# Patient Record
Sex: Female | Born: 1955 | Race: White | Hispanic: No | Smoking: Current every day smoker
Health system: Southern US, Community
[De-identification: ages and names within clinical notes are randomized; demographics above are authoritative.]

## PROBLEM LIST (undated history)

## (undated) DIAGNOSIS — M797 Fibromyalgia: Secondary | ICD-10-CM

## (undated) DIAGNOSIS — I10 Essential (primary) hypertension: Secondary | ICD-10-CM

## (undated) DIAGNOSIS — J449 Chronic obstructive pulmonary disease, unspecified: Secondary | ICD-10-CM

## (undated) DIAGNOSIS — I639 Cerebral infarction, unspecified: Secondary | ICD-10-CM

## (undated) DIAGNOSIS — K219 Gastro-esophageal reflux disease without esophagitis: Secondary | ICD-10-CM

## (undated) DIAGNOSIS — K449 Diaphragmatic hernia without obstruction or gangrene: Secondary | ICD-10-CM

## (undated) DIAGNOSIS — H269 Unspecified cataract: Secondary | ICD-10-CM

## (undated) DIAGNOSIS — F32A Depression, unspecified: Secondary | ICD-10-CM

## (undated) DIAGNOSIS — M1990 Unspecified osteoarthritis, unspecified site: Secondary | ICD-10-CM

## (undated) DIAGNOSIS — F319 Bipolar disorder, unspecified: Secondary | ICD-10-CM

## (undated) DIAGNOSIS — F329 Major depressive disorder, single episode, unspecified: Secondary | ICD-10-CM

## (undated) DIAGNOSIS — F1910 Other psychoactive substance abuse, uncomplicated: Secondary | ICD-10-CM

## (undated) DIAGNOSIS — F419 Anxiety disorder, unspecified: Secondary | ICD-10-CM

## (undated) DIAGNOSIS — F1010 Alcohol abuse, uncomplicated: Secondary | ICD-10-CM

## (undated) HISTORY — PX: CERVICAL FUSION: SHX112

## (undated) HISTORY — DX: Bipolar disorder, unspecified: F31.9

## (undated) HISTORY — PX: OVARY SURGERY: SHX727

## (undated) HISTORY — PX: APPENDECTOMY: SHX54

## (undated) HISTORY — DX: Gastro-esophageal reflux disease without esophagitis: K21.9

## (undated) HISTORY — DX: Unspecified osteoarthritis, unspecified site: M19.90

## (undated) HISTORY — PX: COLONOSCOPY: SHX174

## (undated) HISTORY — DX: Major depressive disorder, single episode, unspecified: F32.9

## (undated) HISTORY — DX: Chronic obstructive pulmonary disease, unspecified: J44.9

## (undated) HISTORY — DX: Essential (primary) hypertension: I10

## (undated) HISTORY — DX: Fibromyalgia: M79.7

## (undated) HISTORY — PX: TUBAL LIGATION: SHX77

## (undated) HISTORY — DX: Unspecified cataract: H26.9

## (undated) HISTORY — DX: Other psychoactive substance abuse, uncomplicated: F19.10

## (undated) HISTORY — DX: Anxiety disorder, unspecified: F41.9

## (undated) HISTORY — DX: Depression, unspecified: F32.A

## (undated) HISTORY — DX: Cerebral infarction, unspecified: I63.9

## (undated) HISTORY — DX: Diaphragmatic hernia without obstruction or gangrene: K44.9

## (undated) HISTORY — PX: OTHER SURGICAL HISTORY: SHX169

---

## 1997-11-09 ENCOUNTER — Emergency Department (HOSPITAL_COMMUNITY): Admission: EM | Admit: 1997-11-09 | Discharge: 1997-11-09 | Payer: Self-pay | Admitting: Emergency Medicine

## 1998-07-30 ENCOUNTER — Other Ambulatory Visit: Admission: RE | Admit: 1998-07-30 | Discharge: 1998-07-30 | Payer: Self-pay | Admitting: Obstetrics and Gynecology

## 1999-02-05 ENCOUNTER — Other Ambulatory Visit: Admission: RE | Admit: 1999-02-05 | Discharge: 1999-02-05 | Payer: Self-pay | Admitting: Obstetrics and Gynecology

## 2001-04-03 ENCOUNTER — Other Ambulatory Visit: Admission: RE | Admit: 2001-04-03 | Discharge: 2001-04-03 | Payer: Self-pay | Admitting: Obstetrics and Gynecology

## 2002-03-21 ENCOUNTER — Encounter: Payer: Self-pay | Admitting: Emergency Medicine

## 2002-03-21 ENCOUNTER — Observation Stay (HOSPITAL_COMMUNITY): Admission: EM | Admit: 2002-03-21 | Discharge: 2002-03-21 | Payer: Self-pay | Admitting: Emergency Medicine

## 2002-10-15 ENCOUNTER — Emergency Department (HOSPITAL_COMMUNITY): Admission: AD | Admit: 2002-10-15 | Discharge: 2002-10-15 | Payer: Self-pay | Admitting: Emergency Medicine

## 2004-03-09 ENCOUNTER — Inpatient Hospital Stay (HOSPITAL_COMMUNITY): Admission: RE | Admit: 2004-03-09 | Discharge: 2004-03-11 | Payer: Self-pay | Admitting: Psychiatry

## 2004-03-09 ENCOUNTER — Ambulatory Visit: Payer: Self-pay | Admitting: Psychiatry

## 2005-02-08 ENCOUNTER — Inpatient Hospital Stay (HOSPITAL_COMMUNITY): Admission: EM | Admit: 2005-02-08 | Discharge: 2005-02-10 | Payer: Self-pay | Admitting: Emergency Medicine

## 2005-03-12 ENCOUNTER — Emergency Department (HOSPITAL_COMMUNITY): Admission: EM | Admit: 2005-03-12 | Discharge: 2005-03-12 | Payer: Self-pay | Admitting: Emergency Medicine

## 2005-08-03 ENCOUNTER — Encounter: Admission: RE | Admit: 2005-08-03 | Discharge: 2005-08-03 | Payer: Self-pay | Admitting: Obstetrics and Gynecology

## 2006-01-27 ENCOUNTER — Emergency Department (HOSPITAL_COMMUNITY): Admission: EM | Admit: 2006-01-27 | Discharge: 2006-01-27 | Payer: Self-pay

## 2006-01-28 ENCOUNTER — Inpatient Hospital Stay (HOSPITAL_COMMUNITY): Admission: EM | Admit: 2006-01-28 | Discharge: 2006-01-29 | Payer: Self-pay | Admitting: Internal Medicine

## 2006-09-01 ENCOUNTER — Encounter: Admission: RE | Admit: 2006-09-01 | Discharge: 2006-09-01 | Payer: Self-pay | Admitting: Internal Medicine

## 2007-02-21 ENCOUNTER — Emergency Department (HOSPITAL_COMMUNITY): Admission: EM | Admit: 2007-02-21 | Discharge: 2007-02-21 | Payer: Self-pay | Admitting: Emergency Medicine

## 2007-06-06 ENCOUNTER — Emergency Department (HOSPITAL_COMMUNITY): Admission: EM | Admit: 2007-06-06 | Discharge: 2007-06-06 | Payer: Self-pay | Admitting: Emergency Medicine

## 2007-11-20 ENCOUNTER — Emergency Department (HOSPITAL_COMMUNITY): Admission: EM | Admit: 2007-11-20 | Discharge: 2007-11-20 | Payer: Self-pay | Admitting: Emergency Medicine

## 2008-10-11 ENCOUNTER — Emergency Department (HOSPITAL_COMMUNITY): Admission: EM | Admit: 2008-10-11 | Discharge: 2008-10-11 | Payer: Self-pay | Admitting: Emergency Medicine

## 2008-10-21 ENCOUNTER — Inpatient Hospital Stay (HOSPITAL_COMMUNITY): Admission: RE | Admit: 2008-10-21 | Discharge: 2008-10-22 | Payer: Self-pay | Admitting: Neurosurgery

## 2009-03-04 ENCOUNTER — Emergency Department (HOSPITAL_COMMUNITY): Admission: EM | Admit: 2009-03-04 | Discharge: 2009-03-04 | Payer: Self-pay | Admitting: Emergency Medicine

## 2009-09-05 ENCOUNTER — Ambulatory Visit: Payer: Self-pay | Admitting: Thoracic Surgery

## 2009-09-10 ENCOUNTER — Encounter (INDEPENDENT_AMBULATORY_CARE_PROVIDER_SITE_OTHER): Payer: Self-pay | Admitting: *Deleted

## 2009-09-10 ENCOUNTER — Encounter: Admission: RE | Admit: 2009-09-10 | Discharge: 2009-09-10 | Payer: Self-pay | Admitting: Thoracic Surgery

## 2009-09-24 ENCOUNTER — Ambulatory Visit: Payer: Self-pay | Admitting: Thoracic Surgery

## 2010-02-21 ENCOUNTER — Encounter: Payer: Self-pay | Admitting: Internal Medicine

## 2010-02-21 ENCOUNTER — Other Ambulatory Visit: Payer: Self-pay | Admitting: Thoracic Surgery

## 2010-02-21 DIAGNOSIS — R911 Solitary pulmonary nodule: Secondary | ICD-10-CM

## 2010-02-23 ENCOUNTER — Encounter: Payer: Self-pay | Admitting: Family Medicine

## 2010-02-27 ENCOUNTER — Encounter (INDEPENDENT_AMBULATORY_CARE_PROVIDER_SITE_OTHER): Payer: Self-pay | Admitting: *Deleted

## 2010-03-05 NOTE — Letter (Signed)
Summary: New Patient letter  [facility]  [address]   [place], [place] [address]   Phone: [phone]  Fax: [phone]       02/27/2010 MRN: [id number]  [facility] [place] [address] [place], [place]  [address]  Dear Ms. Murthy,  Welcome to the Gastroenterology Division at [facility]    You are scheduled to see Dr.  Jarold Motto on 04/02/2010 at 10:00 on the 3rd floor at [facility], [address]  We ask that you try to arrive at our office 15 minutes prior to your appointment time to allow for check-in.  We would like you to complete the enclosed self-administered evaluation form prior to your visit and bring it with you on the day of your appointment.  We will review it with you.  Also, please bring a complete list of all your medications or, if you prefer, bring the medication bottles and we will list them.  Please bring your insurance card so that we may make a copy of it.  If your insurance requires a referral to see a specialist, please bring your referral form from your primary care physician.  Co-payments are due at the time of your visit and may be paid by cash, check or credit card.     Your office visit will consist of a consult with your physician (includes a physical exam), any laboratory testing he/she may order, scheduling of any necessary diagnostic testing (e.g. x-ray, ultrasound, CT-scan), and scheduling of a procedure (e.g. Endoscopy, Colonoscopy) if required.  Please allow enough time on your schedule to allow for any/all of these possibilities.    If you cannot keep your appointment, please call [phone] to cancel or reschedule prior to your appointment date.  This allows [place] the opportunity to schedule an appointment for another patient in need of care.  If you do not cancel or reschedule by 5 p.m. the business day prior to your appointment date, you will be charged a $50.00 late cancellation/no-show fee.    Thank you for choosing  [facility] for your medical needs.  We appreciate the opportunity to care for you.  Please visit [place] at our website  to learn more about our practice.                     Sincerely,                                                             The Gastroenterology Division

## 2010-03-31 ENCOUNTER — Ambulatory Visit (INDEPENDENT_AMBULATORY_CARE_PROVIDER_SITE_OTHER): Payer: Medicaid Other | Admitting: Thoracic Surgery

## 2010-03-31 ENCOUNTER — Ambulatory Visit
Admission: RE | Admit: 2010-03-31 | Discharge: 2010-03-31 | Disposition: A | Discharge status: Home / Self Care | Payer: Medicaid Other | Source: Ambulatory Visit | Attending: Thoracic Surgery | Admitting: Thoracic Surgery

## 2010-03-31 DIAGNOSIS — R911 Solitary pulmonary nodule: Secondary | ICD-10-CM

## 2010-03-31 DIAGNOSIS — K228 Other specified diseases of esophagus: Secondary | ICD-10-CM

## 2010-04-01 DIAGNOSIS — F319 Bipolar disorder, unspecified: Secondary | ICD-10-CM | POA: Insufficient documentation

## 2010-04-01 DIAGNOSIS — J449 Chronic obstructive pulmonary disease, unspecified: Secondary | ICD-10-CM | POA: Insufficient documentation

## 2010-04-01 DIAGNOSIS — IMO0001 Reserved for inherently not codable concepts without codable children: Secondary | ICD-10-CM | POA: Insufficient documentation

## 2010-04-01 DIAGNOSIS — F329 Major depressive disorder, single episode, unspecified: Secondary | ICD-10-CM | POA: Insufficient documentation

## 2010-04-01 DIAGNOSIS — J4489 Other specified chronic obstructive pulmonary disease: Secondary | ICD-10-CM | POA: Insufficient documentation

## 2010-04-01 DIAGNOSIS — F1910 Other psychoactive substance abuse, uncomplicated: Secondary | ICD-10-CM | POA: Insufficient documentation

## 2010-04-01 NOTE — Assessment & Plan Note (Signed)
OFFICE VISIT  NESIAH, JUMP DOB:  [birth year]                                        March 31, 2010 CHART #:  [id number]  Ms. Cadet returns today with a CT scanat6 months although we do not have a final report.  Additionally, this  shows no changes in her CT scan.; Her blood pressure is 136/80, pulse 70, respirations 20, sats were 97%.  She is still having back pain and chest pain but there is really no other cause on her CT that would cause her pain.  For this reason, we  do not plan to see her back again and will refer her back to you for long-term followup.Wewill be happy have to see her again for any other thoracic problem.  Ines Bloomer, M.D. Electronically Signed  DPB/MEDQ  D:  03/31/2010  T:  04/01/2010  Job:  [id number]

## 2010-04-02 ENCOUNTER — Other Ambulatory Visit: Payer: Self-pay | Admitting: Gastroenterology

## 2010-04-02 ENCOUNTER — Encounter (INDEPENDENT_AMBULATORY_CARE_PROVIDER_SITE_OTHER): Payer: Self-pay | Admitting: *Deleted

## 2010-04-02 ENCOUNTER — Other Ambulatory Visit: Payer: Medicaid Other

## 2010-04-02 ENCOUNTER — Ambulatory Visit (INDEPENDENT_AMBULATORY_CARE_PROVIDER_SITE_OTHER): Payer: Medicaid Other | Admitting: Gastroenterology

## 2010-04-02 ENCOUNTER — Encounter: Payer: Self-pay | Admitting: Gastroenterology

## 2010-04-02 DIAGNOSIS — M129 Arthropathy, unspecified: Secondary | ICD-10-CM | POA: Insufficient documentation

## 2010-04-02 DIAGNOSIS — R195 Other fecal abnormalities: Secondary | ICD-10-CM | POA: Insufficient documentation

## 2010-04-02 DIAGNOSIS — K902 Blind loop syndrome, not elsewhere classified: Secondary | ICD-10-CM

## 2010-04-02 DIAGNOSIS — F411 Generalized anxiety disorder: Secondary | ICD-10-CM | POA: Insufficient documentation

## 2010-04-02 DIAGNOSIS — Z87448 Personal history of other diseases of urinary system: Secondary | ICD-10-CM | POA: Insufficient documentation

## 2010-04-02 DIAGNOSIS — F1020 Alcohol dependence, uncomplicated: Secondary | ICD-10-CM | POA: Insufficient documentation

## 2010-04-02 DIAGNOSIS — Z79899 Other long term (current) drug therapy: Secondary | ICD-10-CM

## 2010-04-02 DIAGNOSIS — K219 Gastro-esophageal reflux disease without esophagitis: Secondary | ICD-10-CM | POA: Insufficient documentation

## 2010-04-02 LAB — CBC WITH DIFFERENTIAL/PLATELET
Eosinophils Relative: 3.2 % (ref 0.0–5.0)
Lymphocytes Relative: 30.3 % (ref 12.0–46.0)
Lymphs Abs: 2.5 10*3/uL (ref 0.7–4.0)
MCHC: 34.5 g/dL (ref 30.0–36.0)
Platelets: 334 10*3/uL (ref 150.0–400.0)
RDW: 13.6 % (ref 11.5–14.6)
WBC: 8.3 10*3/uL (ref 4.5–10.5)

## 2010-04-02 LAB — TSH: TSH: 1.32 u[IU]/mL (ref 0.35–5.50)

## 2010-04-02 LAB — HEPATIC FUNCTION PANEL
ALT: 13 U/L (ref 0–35)
Albumin: 4 g/dL (ref 3.5–5.2)
Alkaline Phosphatase: 60 U/L (ref 39–117)
Bilirubin, Direct: 0 mg/dL (ref 0.0–0.3)
Total Bilirubin: 0.3 mg/dL (ref 0.3–1.2)
Total Protein: 7.5 g/dL (ref 6.0–8.3)

## 2010-04-02 LAB — CONVERTED CEMR LAB: Tissue Transglutaminase Ab, IgA: 11 units (ref <20)

## 2010-04-02 LAB — VITAMIN B12: Vitamin B-12: 296 pg/mL (ref 211–911)

## 2010-04-02 LAB — BASIC METABOLIC PANEL
BUN: 11 mg/dL (ref 6–23)
Calcium: 9.4 mg/dL (ref 8.4–10.5)
Creatinine, Ser: 0.8 mg/dL (ref 0.4–1.2)
Glucose, Bld: 75 mg/dL (ref 70–99)
Potassium: 4.7 mEq/L (ref 3.5–5.1)

## 2010-04-02 LAB — FERRITIN: Ferritin: 52.3 ng/mL (ref 10.0–291.0)

## 2010-04-09 NOTE — Assessment & Plan Note (Signed)
Summary: POSITIVE STOOL CARD.Marland KitchenJJ. MEDICAID//[email or web address]//SCH [email or web address]/SYLVIA [id number]...   History of Present Illness Visit Type: Initial Consult Primary GI MD: Sheryn Bison MD FACP FAGA Primary Provider: Frances Maywood, MD  Requesting Provider: Frances Maywood, MD  Chief Complaint: Patient was referred for heme positive stool card. Patient denies any GI complaints today   History of Present Illness:   very nice 55 year old Caucasian female in recovery from alcoholism and substance abuse currently on Suboxone 3 times a day. She also has a history of bipolar disorder, depression predominant, fibromyalgia, COPD from smoking. She is referred today by Dr. Windle Guard for evaluation of guaiac positive stool found on recent physical exam.  The patient denies any GI complaints whatsoever. She has regular bowel movements without melena or hematochezia, denies acid reflux or dysphagia, and gives no history of hepatitis or pancreatitis or known liver disease. Her appetite is good and her weight is stable. She has not had previous endoscopic exams or colonoscopy, but has had recent normal chest x-ray. She does suffer from recurrent urinary tract infections and is currently on Septra DS. She continues to have regular menstrual periods and takes oral estrogen compounds and daily aspirin. She takes Paxil 20 mg a day for depression. Family history is noncontributory.   GI Review of Systems      Denies abdominal pain, acid reflux, belching, bloating, chest pain, dysphagia with liquids, dysphagia with solids, heartburn, loss of appetite, nausea, vomiting, vomiting blood, weight loss, and  weight gain.      Reports heme positive stool.     Denies anal fissure, black tarry stools, change in bowel habit, constipation, diarrhea, diverticulosis, fecal incontinence, hemorrhoids, irritable bowel syndrome, jaundice, light color stool, liver problems, rectal bleeding, and  rectal pain.    Current Medications (verified): 1)   Paxil 20 Mg Tabs (Paroxetine Hcl) .... 60mg  By Mouth Once Daily 2)  Suboxone 8-2 Mg Subl (Buprenorphine Hcl-Naloxone Hcl) .... Three Times A Day 3)  Aspirin 81 Mg Tbec (Aspirin) .... One Tablet By Mouth Once Daily 4)  Provera 10 Mg Tabs (Medroxyprogesterone Acetate) .... One Tablet By Mouth Once Daily 5)  Menest 1.25 Mg Tabs (Esterified Estrogens) .... One Tablet By Mouth The First Through The Twenty Fifth of Every Month 6)  Sulfamethoxazole-Tmp Ds 800-160 Mg Tabs (Sulfamethoxazole-Trimethoprim) .... One Tablet By Mouth Every 12 Hours For Three Days  Allergies (verified): No Known Drug Allergies  Past History:  Past medical, surgical, family and social histories (including risk factors) reviewed for relevance to current acute and chronic problems.  Past Medical History: UTI'S, HX OF (ICD-V13.00) ARTHRITIS (ICD-716.90) ANXIETY (ICD-300.00) ALCOHOLISM (ICD-303.90) GERD (ICD-530.81) DEPRESSION (ICD-311) SUBSTANCE ABUSE, MULTIPLE (ICD-305.90) COPD (ICD-496) FIBROMYALGIA (ICD-729.1) BIPOLAR AFFECTIVE DISORDER (ICD-296.80)  Past Surgical History: laser gum surgery Appendectomy Tubal Ligation Back Surgery  Family History: Reviewed history and no changes required. No FH of Colon Cancer: Family History of Diabetes: Father and Multiple Members on Fathers side  Family History of Heart Disease: Father and Mother   Social History: Reviewed history from 04/01/2010 and no changes required. Unemployed Divorced Child Patient currently smokes.  Alcohol Use - yes: 1-2 daily  Daily Caffeine Use: 2-3 daily  Illicit Drug Use - no Drug Use:  no  Review of Systems       The patient complains of anxiety-new, arthritis/joint pain, back pain, change in vision, confusion, cough, depression-new, fatigue, itching, menstrual pain, muscle pains/cramps, night sweats, shortness of breath, sleeping problems, sore throat, swelling of feet/legs, swollen lymph glands, thirst -  excessive, urination -  excessive, urination changes/pain, and urine leakage.  The patient denies allergy/sinus, anemia, blood in urine, breast changes/lumps, coughing up blood, fainting, fever, headaches-new, hearing problems, heart murmur, heart rhythm changes, nosebleeds, pregnancy symptoms, skin rash, thirst - excessive , urination - excessive , vision changes, and voice change.    Vital Signs:  Patient profile:   55 year old female Height:      64 inches Weight:      111 pounds BMI:     19.12 BSA:     1.52 Pulse rate:   68 / minute Pulse rhythm:   regular BP sitting:   128 / 74  (left arm) Cuff size:   regular  Vitals Entered By: Ok Anis CMA (April 02, 2010 10:22 AM)  Physical Exam  General:  very thin appearing patient with obvious emphysema. She is in no acute distress. Head:  Normocephalic and atraumatic. Eyes:  PERRLA, no icterus.exam deferred to patient's ophthalmologist.   Neck:  Supple; no masses or thyromegaly. Lungs:  Clear throughout to auscultation.decreased BS on L and decreased BS on R.   Heart:  Regular rate and rhythm; no murmurs, rubs,  or bruits. Abdomen:  Soft, nontender and nondistended. No masses, hepatosplenomegaly or hernias noted. Normal bowel sounds. Rectal:  deferred until time of colonoscopy.   Msk:  Symmetrical with no gross deformities. Normal posture. Pulses:  Normal pulses noted. Extremities:  No clubbing, cyanosis, edema or deformities noted. Neurologic:  Alert and  oriented x4;  grossly normal neurologically. Cervical Nodes:  No significant cervical adenopathy. Psych:  Alert and cooperative. Normal mood and affect.   Impression & Recommendations:  Problem # 1:  FECAL OCCULT BLOOD (ICD-792.1) Assessment Unchanged the patient has no GI complaints but does have a guaiac positive stool. Anemia profile ordered and we will schedule endoscopy and colonoscopy at her convenience with propofol sedation because of her Suboxone use and history of substance abuse in the  past. She has minor acid reflux symptoms, denies NSAID use, but does take daily aspirin 81 mg. Orders: TLB-CBC Platelet - w/Differential (85025-CBCD) TLB-BMP (Basic Metabolic Panel-BMET) (80048-METABOL) TLB-Hepatic/Liver Function Pnl (80076-HEPATIC) TLB-TSH (Thyroid Stimulating Hormone) (84443-TSH) TLB-B12, Serum-Total ONLY ([address]-U04) TLB-Ferritin (82728-FER) TLB-Folic Acid (Folate) (82746-FOL) TLB-IBC Pnl (Iron/FE;Transferrin) (83550-IBC) TLB-IgA (Immunoglobulin A) (82784-IGA) T-Sprue Panel (Celiac Disease Aby Eval) (83516x3/[address]) Colon/Endo (Colon/Endo)  Problem # 2:  ALCOHOLISM (ICD-303.90) Assessment: Improved She Apparently has cut down her drinking dramatically and also currently is on Chantix to try to stop her history of cigarette abuse. She has mild emphysema on physical exam with decreased exercise tolerance. However, she should be able to tolerate endoscopy and colonoscopy with a balanced electrolyte solution preparation beforehand. We will monitor her closely with propofol sedation.  Problem # 3:  DEPRESSION (ICD-311) Assessment: Improved continue Paxil 20 mg a day as per primary care. She relates that her depression bipolar disorder is under good control at this time.  Problem # 4:  COPD (ICD-496) Assessment: Deteriorated She Has Been Urged to stop cigarettes as per smoking cessation plan per primary care.  Patient Instructions: 1)  Copy sent to : Frances Maywood, MD  2)  Your procedure has been scheduled for 05/06/2010, please follow the seperate instructions.  3)  [facility] Patient Information Guide given to patient.  4)  Colonoscopy and Flexible Sigmoidoscopy brochure given.  5)  Upper Endoscopy brochure given.  6)  You will have Propofol sedation when you have your Endo/Colon. 7)  Please go to the  basement today for your labs.  8)  The medication list was reviewed and reconciled.  All changed / newly prescribed medications were explained.  A  complete medication list was provided to the patient / caregiver. Prescriptions: MOVIPREP 100 GM  SOLR (PEG-KCL-NACL-NASULF-NA ASC-C) As per prep instructions.  #1 x 0   Entered by:   Harlow Mares CMA (AAMA)   Authorized by:   Mardella Layman MD [facility]   Signed by:   Harlow Mares CMA (AAMA) on 04/02/2010   Method used:   Samples Given   RxID:   [id number]

## 2010-04-09 NOTE — Letter (Signed)
Summary: [facility] Instructions  [facility]  [address] [place], [place] [address]   Phone: [phone]  Fax: [phone]       [name]    April 24, 1955    MRN: [id number]        Procedure Day Dorna Bloom: Wednesday 05/06/2010     Arrival Time: 8:30am     Procedure Time: 9:30am     Location of Procedure:                    X  [facility] (4th Floor)                        PREPARATION FOR COLONOSCOPY WITH MOVIPREP   Starting 5 days prior to your procedure 05/02/2010 do not eat nuts, seeds, popcorn, corn, beans, peas,  salads, or any raw vegetables.  Do not take any fiber supplements (e.g. Metamucil, Citrucel, and Benefiber).  THE DAY BEFORE YOUR PROCEDURE         Tuesday 05/05/2010  1.  Drink clear liquids the entire day-NO SOLID FOOD  2.  Do not drink anything colored red or purple.  Avoid juices with pulp.  No orange juice.  3.  Drink at least 64 oz. (8 glasses) of fluid/clear liquids during the day to prevent dehydration and help the prep work efficiently.  CLEAR LIQUIDS INCLUDE: Water Jello Ice Popsicles Tea (sugar ok, no milk/cream) Powdered fruit flavored drinks Coffee (sugar ok, no milk/cream) Gatorade Juice: apple, white grape, white cranberry  Lemonade Clear bullion, consomm, broth Carbonated beverages (any kind) Strained chicken noodle soup Hard Candy                             4.  In the morning, mix first dose of MoviPrep solution:    Empty 1 Pouch A and 1 Pouch B into the disposable container    Add lukewarm drinking water to the top line of the container. Mix to dissolve    Refrigerate (mixed solution should be used within 24 hrs)  5.  Begin drinking the prep at 5:00 p.m. The MoviPrep container is divided by 4 marks.   Every 15 minutes drink the solution down to the next mark (approximately 8 oz) until the full liter is complete.   6.  Follow completed prep with 16 oz of clear liquid of your choice (Nothing red or  purple).  Continue to drink clear liquids until bedtime.  7.  Before going to bed, mix second dose of MoviPrep solution:    Empty 1 Pouch A and 1 Pouch B into the disposable container    Add lukewarm drinking water to the top line of the container. Mix to dissolve    Refrigerate  THE DAY OF YOUR PROCEDURE      Wednesday 05/06/2010  Beginning at 4:30am (5 hours before procedure):         1. Every 15 minutes, drink the solution down to the next mark (approx 8 oz) until the full liter is complete.  2. Follow completed prep with 16 oz. of clear liquid of your choice.    3. You may drink clear liquids until 7:30am (2 HOURS BEFORE PROCEDURE).   MEDICATION INSTRUCTIONS  Unless otherwise instructed, you should take regular prescription medications with a small sip of water   as early as possible the morning of your procedure.  OTHER INSTRUCTIONS  You will need a responsible adult at least 53 years of age to accompany you and drive you home.   This person must remain in the waiting room during your procedure.  Wear loose fitting clothing that is easily removed.  Leave jewelry and other valuables at home.  However, you may wish to bring a book to read or  an iPod/MP3 player to listen to music as you wait for your procedure to start.  Remove all body piercing jewelry and leave at home.  Total time from sign-in until discharge is approximately 2-3 hours.  You should go home directly after your procedure and rest.  You can resume normal activities the  day after your procedure.  The day of your procedure you should not:   Drive   Make legal decisions   Operate machinery   Drink alcohol   Return to work  You will receive specific instructions about eating, activities and medications before you leave.    The above instructions have been reviewed and explained to me by   _______________________    I fully understand and can verbalize these instructions  _____________________________ Date _________

## 2010-05-05 ENCOUNTER — Encounter: Payer: Self-pay | Admitting: Gastroenterology

## 2010-05-06 ENCOUNTER — Encounter: Payer: Self-pay | Admitting: Gastroenterology

## 2010-05-06 ENCOUNTER — Ambulatory Visit (AMBULATORY_SURGERY_CENTER): Payer: Medicaid Other | Admitting: Gastroenterology

## 2010-05-06 VITALS — BP 126/76 | HR 76 | Temp 98.7°F | Resp 18 | Ht 63.0 in | Wt 110.0 lb

## 2010-05-06 DIAGNOSIS — K2940 Chronic atrophic gastritis without bleeding: Secondary | ICD-10-CM

## 2010-05-06 DIAGNOSIS — K2980 Duodenitis without bleeding: Secondary | ICD-10-CM | POA: Insufficient documentation

## 2010-05-06 DIAGNOSIS — K635 Polyp of colon: Secondary | ICD-10-CM | POA: Insufficient documentation

## 2010-05-06 DIAGNOSIS — D128 Benign neoplasm of rectum: Secondary | ICD-10-CM

## 2010-05-06 DIAGNOSIS — R195 Other fecal abnormalities: Secondary | ICD-10-CM | POA: Insufficient documentation

## 2010-05-06 DIAGNOSIS — K5730 Diverticulosis of large intestine without perforation or abscess without bleeding: Secondary | ICD-10-CM

## 2010-05-06 DIAGNOSIS — D129 Benign neoplasm of anus and anal canal: Secondary | ICD-10-CM

## 2010-05-06 MED ORDER — OMEPRAZOLE 40 MG PO CPDR: 40.0000 mg | DELAYED_RELEASE_CAPSULE | Freq: Every day | ORAL | Status: DC

## 2010-05-06 MED ORDER — SODIUM CHLORIDE 0.9 % IV SOLN: 500.0000 mL | INTRAVENOUS | Status: DC

## 2010-05-06 NOTE — Patient Instructions (Addendum)
MILD DIVERTICULOSIS-HANDOUT GIVEN POLYPS-HANDOUT GIVEN HIGH FIBER DIET HANDOUT GIVEN NO ASPIRIN FOR ONE MONTH-TYLENOL ONLY AS NEEDED TAKE OMEPRAZOLE EVERY DAY FOR ONE MONTH AS WELL- PERSCIPTION GIVEN TO PATIENT FOLLOW UP WITH YOUR PRIMARY CARE PROVIDER

## 2010-05-07 ENCOUNTER — Telehealth: Payer: Self-pay | Admitting: *Deleted

## 2010-05-07 DIAGNOSIS — K2980 Duodenitis without bleeding: Secondary | ICD-10-CM

## 2010-05-07 DIAGNOSIS — K902 Blind loop syndrome, not elsewhere classified: Secondary | ICD-10-CM

## 2010-05-07 LAB — HELICOBACTER PYLORI SCREEN-BIOPSY: UREASE: NEGATIVE

## 2010-05-07 NOTE — Telephone Encounter (Signed)

## 2010-05-08 LAB — URINALYSIS, ROUTINE W REFLEX MICROSCOPIC
Bilirubin Urine: NEGATIVE
Glucose, UA: NEGATIVE mg/dL
Ketones, ur: NEGATIVE mg/dL
pH: 6 (ref 5.0–8.0)

## 2010-05-08 LAB — URINE MICROSCOPIC-ADD ON

## 2010-05-08 LAB — CBC
RBC: 5.02 MIL/uL (ref 3.87–5.11)
WBC: 7.9 10*3/uL (ref 4.0–10.5)

## 2010-05-08 LAB — APTT: aPTT: 32 seconds (ref 24–37)

## 2010-05-08 LAB — PROTIME-INR
INR: 0.9 (ref 0.00–1.49)
Prothrombin Time: 12.1 seconds (ref 11.6–15.2)

## 2010-05-08 LAB — BASIC METABOLIC PANEL
Calcium: 9.7 mg/dL (ref 8.4–10.5)
Creatinine, Ser: 0.73 mg/dL (ref 0.4–1.2)
GFR calc Af Amer: >60 mL/min (ref >60)

## 2010-05-12 ENCOUNTER — Encounter: Payer: Self-pay | Admitting: Gastroenterology

## 2010-05-13 ENCOUNTER — Encounter: Payer: Self-pay | Admitting: Gastroenterology

## 2010-05-18 ENCOUNTER — Encounter: Payer: Self-pay | Admitting: Gastroenterology

## 2010-05-19 ENCOUNTER — Telehealth: Payer: Self-pay | Admitting: Gastroenterology

## 2010-05-20 ENCOUNTER — Encounter: Payer: Self-pay | Admitting: Gastroenterology

## 2010-05-20 NOTE — Telephone Encounter (Signed)
Pt called for path results from her ECL. Informed pt she was negative for H.Pylori. Traci in ENDO is still working on Event organiser.

## 2010-06-16 NOTE — Letter (Signed)
September 24, 2009   Tonye Royalty  [facility]   Re:  [name], [name]              DOB:  [birth year]   Dr. Dr. 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  It is really very small.  The swallow  did show a small hiatal hernia but most important she was having  secondary and tertiary contractions, and her pain may be a lot of time  related to esophageal spasm.  If it continues, I would suggest that she  see a gastroenterologist.  I will see her back again in 6 months with a  CT scan just to be sure that there is no change in the cyst and if there  is no change, then we will refer back to you for long-term followup.  Her blood pressure is 140/89, pulse 76, respirations 18, sats were 96%.   Ines Bloomer, M.D.  Electronically Signed   DPB/MEDQ  D:  09/24/2009  T:  09/25/2009  Job:  [id number]

## 2010-06-16 NOTE — Letter (Signed)
September 05, 2009   Dr. Tonye Royalty  [facility].   Re:  [name], [name]              DOB:  March 08, 1955   Dear Dr. Wyline Beady:   The patient is 55 year old Caucasian female who is a long-time smoker,  has had a history of fibromyalgia and was followed in the Pain Clinic.  She was found to have a pain.  She has had previous cervical fusion by  Dr. Phoebe Perch and she also has a history of a year or so left chest pain  that is getting worse that starts under her left breast and goes  laterally as a cramping pain.  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  The mass was 1.6 x 1.2 x 1.3 cm and it is next to the esophagus.  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  This is on the left  side of the esophagus and is between the esophagus and the descending  thoracic aorta.  She has no dysphagia.  No weight loss.   MEDICATIONS:  1. Suboxone 8/2 mg every 8 hours.  2. Medroxyprogesterone 10 mg.  3. Estradiol 2 mg.  4. Paroxetine 40 mg daily.  5. Ventolin.  6. Spiriva.   ALLERGIES:  She is allergic to Benadryl that cause some occasional rash.   PAST MEDICAL HISTORY:  She has COPD.   FAMILY HISTORY:  Noncontributory.   SOCIAL HISTORY:  She is single and has 1 child.  Smokes 1 pack of  cigarettes a day.  Occasional alcohol intake.   REVIEW OF SYSTEMS:  VITAL SIGNS:  She is 5 feet 4 inches, 110 pounds.  GENERAL:  Her weight has been stable.  CARDIAC:  She has chest tightness and shortness of breath.  No angina.  PULMONARY:  Bronchitis and wheezing.  No hemoptysis.  GI:  No nausea, vomiting, constipation, or diarrhea.  GU:  No kidney disease, dysuria, or frequent urination.  VASCULAR:  She has pain in legs with walking.  No TIAs or DVT.  NEUROLOGICAL:  She has got no dizziness headaches, blackouts, or  seizures.  MUSCULOSKELETAL:  Arthritis and fibromyalgia.  PSYCHIATRIC:  Depression and nervousness.  EYE/ENT:  No change in eyesight or hearing.  HEMATOLOGICAL:  No problems with bleeding, clotting disorders, or  anemia.   PHYSICAL EXAMINATION:  General:  She is a thin Caucasian female, in no  acute distress.  Vital Signs:  Her blood pressure was 147/90, pulse 72,  respirations 18, and sats were 96%.  Head, Eyes, Ears, Nose, and Throat:  Unremarkable.  Neck:  Supple without thyromegaly.  There is no  supraclavicular or axillary adenopathy.  Chest:  Clear to auscultation  and percussion.  Heart:  Regular, sinus rhythm.  No murmurs.  Abdomen:  Soft.  There is no hepatosplenomegaly.  Extremities:  Pulses are 2+.  There is no clubbing or edema.  Neurologic:  She is oriented x3.  Sensory and motor intact.  Cranial nerves intact.   I feel that this duplication cyst is probably not the cause of her left  chest pain.  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  I plan to get a barium swallow on her just to be sure there is no  esophageal adenopathies, but given the size and location, I would not  recommend any further treatment other than observation, probably a  repeat CT scan in 6 months.  I appreciate the opportunity of seeing the  patient.   Sincerely,   Ines Bloomer, M.D.  Electronically Signed   DPB/MEDQ  D:  09/05/2009  T:  09/06/2009  Job:  [id number]
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
## 2010-06-19 NOTE — H&P (Signed)
NAMEADAMARIZ, [name] NO.:  [id number]   MEDICAL RECORD NO.:  [id number]          PATIENT TYPE:  EMS   LOCATION:  MAJO                         FACILITY:  [facility]   PHYSICIAN:  Sherin Quarry, MD      DATE OF BIRTH:  [birth year]   DATE OF ADMISSION:  02/08/2005  DATE OF DISCHARGE:                                HISTORY & PHYSICAL   HISTORY OF PRESENT ILLNESS:  [name] is a 55 year old lady who is an  exceedingly poor historian.  According to the patient's mother, today her ex-  husband called to report that [name] had a seizure, which he described  as shaking all over.  This was followed by a period where [name]  appeared to be unconscious.  By the time she was transported to the  emergency room, she was lethargic, but was otherwise alert.  She was not  observed to have a seizure.  On arrival, her blood pressure was 117/42,  pulse 82, and O2 saturation was 96%.  Relevant studies obtained in the  emergency room included a urinalysis which showed many bacteria, but no  white cells.  A urine drug screen which was positive for cocaine, a complete  blood count which showed a white count of 15,500 and a hemoglobin of 17.7, a  negative blood alcohol level, and a CMET which was normal.  The chest x-ray  showed no acute disease.  A CT scan of the brain was negative  The patient  remained lethargic during a period of observation of approximately 6 hours.  Because she lives alone, and because of the possible history of seizure, it  was felt appropriate to admit her to the hospital at this time.  Mrs. Cancel  basically will not cooperative with answering questions.  She denies having  used cocaine, and states that she has not been abusing any other drugs.  Records from her most recent discharge from [facility] suggest that  in the past she has perhaps been abusing Xanax and OxyContin.  Also, Mrs.  Essman indicates that she has not been drinking any alcohol  recently,  although I am not sure if this is accurate.  She also states that she has no  recollection for anything that happened today or yesterday.  Her mother says  that she has been living with an elderly man who died either yesterday or  today, and this may have affected her mental status.   PAST MEDICAL HISTORY AND MEDICATIONS:  She currently takes -  1.  Paxil 40 mg daily.  2.  Soma compound 350 mg daily.  (But she insists that she does not know where she gets these medicines.)   ALLERGIES:  No known drug allergies.   OPERATIONS:  Her mother states that she has not had any operations.   MEDICAL ILLNESSES:  Her mother states that she has been treated for  fibromyalgia in the past.  At [facility], she was given a diagnosis  of bipolar disorder.   FAMILY HISTORY:  Noncontributory.   SOCIAL HISTORY:  The patient essentially lives by herself.  She smokes one  pack of cigarettes per day.  It is completely unclear what her alcohol and  drug use history is.   REVIEW OF SYSTEMS:  HEAD:  She reports a dull throbbing headache.  EARS/NOSE/THROAT:  Denies earache, sinus pain or sore throat.  CHEST:  Reports that she has a mild persistent dry cough.  CARDIOVASCULAR:  Denies  chest pain, orthopnea or PND.  GI:  Denies nausea, vomiting or abdominal  pain.  GU:  Denies dysuria or urinary frequency.   PHYSICAL EXAMINATION:  GENERAL:  She is an apparently alert lady who will  cooperative with commands, but does not give any useful medical history.  HEENT:  Within normal limits.  CHEST:  Remarkable for diminished breath sounds.  There are a few scattered  rhonchi.  CARDIOVASCULAR:  Normal S1 and S2.  There are no rubs, murmurs or gallops.  ABDOMEN:  Benign.  There are normal bowel sounds.  There are no masses or  tenderness.  No guarding or rebound.  NEUROLOGIC:  The patient is oriented x3, but otherwise will not give any  history.  She moves all extremities.  She will follow  physical commands.   IMPRESSION:  1.  Possible seizure, although very inadequate history.  2.  Cocaine abuse on the basis of urine drug screen.  Past history of      narcotic and Xanax abuse.  3.  Bipolar disorder per discharge summary from [facility]  4.  Depression.  5.  Chronic bronchitis.  6.  Possible urinary tract infection.   The patient will be admitted for observation, and I think we will need to  get a psychiatry consult to determine how best to manage her care in the  future.           ______________________________  Sherin Quarry, MD     SY/MEDQ  D:  02/08/2005  T:  02/08/2005  Job:  [id number]

## 2010-06-19 NOTE — Discharge Summary (Signed)
[name], [name] NO.:  [id number]   MEDICAL RECORD NO.:  [id number]          PATIENT TYPE:  INP   LOCATION:  0153                         FACILITY:  [facility]   PHYSICIAN:  Hettie Holstein, D.O.    DATE OF BIRTH:  1955/05/22   DATE OF ADMISSION:  01/28/2006  DATE OF DISCHARGE:  01/29/2006                         DISCHARGE SUMMARY - REFERRING   TRANSFER DATE:  January 29, 2006, pending behavioral health bed  availability.   PRINCIPAL DIAGNOSIS:  Polysubstance dependence and drug overdose with  heroin and cocaine.   ADDITIONAL DIAGNOSES:  1. Bipolar disorder.  2. Tobacco dependence.  3. Depression.   TRANSFER MEDICATIONS:  1. She can resume Chantex, as she was on prior to admission. She can      start at 0.5 mg p.o. daily x3 days, then 0.5 mg p.o. b.i.d. x4      days, and then 1 p.o. b.i.d. thereafter. If this is not available,      she can change to Nicotine patch 21 mg per 24 hours, change q.24.  2. Paxil 80 mg daily.  3. Senokot daily. Hold if she develops diarrhea.  4. Valium 5 mg p.o. q.6 hours p.r.n. agitation/anxiety.  5. She can continue ciprofloxacin at 500 mg p.o. b.i.d., to conclude      on January 31, 2006. However, she is getting urinalysis performed.      If these results are negative, she can discontinue this medication.   DISPOSITION:  [name] is medically stable for transition to  behavioral health. She was evaluated by Dr. Jeanie Sewer, who recommends  inpatient psychiatric admission. We are currently awaiting these  arrangements to be made.   HISTORY OF PRESENT ILLNESS:  For full details, please refer to  comprehensive history, as dictated by Dr. Della Goo. However,  briefly, [name] is a 55 year old female with a history of  polysubstance abuse. She has intermittently used heroin in the past.  Recently, she has restarted using inhaled heroin and cocaine. She  inadvertently overdosed prior to the evening of  admission. She was noted  to have presented to the emergency department confused, disoriented, and  eventually found unresponsive per emergency room notes. It was noticed  that she required non-invasive ventilation with a bag valve mask by the  emergency department physician. She received Narcan with prompt  response. She has remained alert and oriented since this time.   HOSPITAL COURSE:  She was admitted to the step-down Intensive Care Unit  for close observation and monitoring. She was followed by a 24 hour  sitter. Psychiatry was consulted and she was evaluated and felt to be  appropriate for psychiatric ward admission. She denied suicidal  ideation, though it is elicited that she was depressed and polysubstance  dependent. She is medically stable for transition. She had a low grade  temperature. Urinalysils pending. She is empirically on ciprofloxacin  until urinalysis results are resulted and culture.   LABORATORY DATA:  Her sodium is 136, potassium 3.7, BUN 10, creatinine  0.6, glucose 78, AST and ALT 16/10. Albumin 3.4. White blood cell count  10.6. Hemoglobin  11.7. Platelet count 173.      Hettie Holstein, D.O.  Electronically Signed     ESS/MEDQ  D:  01/29/2006  T:  01/29/2006  Job:  [id number]

## 2010-06-19 NOTE — Discharge Summary (Signed)
   NAME:  [name], [name]                        ACCOUNT NO.:  [id number]   MEDICAL RECORD NO.:  [id number]                   PATIENT TYPE:  OBV   LOCATION:  3040                                 FACILITY:  [facility]   PHYSICIAN:  Lazaro Arms, M.D.        DATE OF BIRTH:  1955-08-19   DATE OF ADMISSION:  03/20/2002  DATE OF DISCHARGE:  03/21/2002                                 DISCHARGE SUMMARY   DISCHARGE DIAGNOSES:  1. Community-acquired pneumonia.  2. History of tobacco use.  3. Dehydration.   HISTORY OF PRESENT ILLNESS:  The patient came to the emergency room with  increasing shortness of breath, fatigue, and worsening cough despite p.o.  Zithromax, which was prescribed by an outlying physician.  In the emergency  room, she also had a decreased appetite and was not eating or drinking much.  In the emergency room, she was afebrile and 95% on room air, although her  blood pressure was low and her pulse was elevated initially.  Her x-ray  revealed a pneumonia.   HOSPITAL COURSE:  She was admitted, placed on IV fluids, and monitored  overnight secondary to the hypotension.  She clinically improved.  She was  discharged home in stable condition on March 21, 2002.  She was afebrile,  the blood pressure was 115/57, she was 95% on room air, and the pulse was  85.  She did have some scattered rales.  Air movement was good with  occasional rhonchi.  Her skin color and turgor were much improved.   DISPOSITION:  The patient will go home.   SPECIAL INSTRUCTIONS:  She was told to drink at least 2000 cubic centimeters  of fluid per day for the next few days.  She was told to quit smoking and  was given some advice to help in order to succeed with that goal.   FOLLOW-UP:  She will follow up with her primary care physician by the end of  the week to make sure she is continuing to improve.   DISCHARGE MEDICATIONS:  1. Tequin 400 mg p.o. daily for seven more days.  2. Guiafenesin  1200 mg p.o. q.12h. p.r.n.  3. Albuterol MDI two puffs q.4h. p.r.n.  4. Continue taking Zoloft as she was before.  5. Continue taking Prilosec as she was before.                                               Lazaro Arms, M.D.    AMC/MEDQ  D:  03/21/2002  T:  03/21/2002  Job:  [id number]

## 2010-06-19 NOTE — Consult Note (Signed)
[name], [name] NO.:  [id number]   MEDICAL RECORD NO.:  [id number]          PATIENT TYPE:  INP   LOCATION:  0153                         FACILITY:  [facility]   PHYSICIAN:  Antonietta Breach, M.D.  DATE OF BIRTH:  Feb 16, 1955   DATE OF CONSULTATION:  01/28/2006  DATE OF DISCHARGE:  01/29/2006                                 CONSULTATION   REQUESTING PHYSICIAN:  Hettie Holstein, D.O.   REASON FOR CONSULTATION:  Depression polysubstance overdose.   HISTORY OF PRESENT ILLNESS:  [name] is a 55 year old  female admitted to the [facility] on January 28, 2006 after overdosing on heroin and cocaine.  Her ex-husband had  reported that the patient had a convulsion which involved shaking all  over.  He also described a period where she was noted to be unconscious,  following the convulsion.  She was not observed to have a seizure.   Her urine drug screen in the emergency room was positive for cocaine the  patient continued to remain lethargic while in the emergency room for 6  hours.  She initially denied any ongoing substance abuse; however, as  time passed; she revealed that her obtundation involved a drug overdose  with heroin.  She also had been using cocaine. The patient now remains  in the intensive care unit; and has recovered, her alert disposition and  is also oriented; she is cooperative with care.  She does continue with  depressed mood, low energy, poor concentration with a very high level of  craving.  She would have difficulty performing activities of daily  living, at this point, due to the degree of her depression.  She does  not have thoughts of harming herself or others.  She has no  hallucinations or delusions.  The patient continues on her psychotropics  that have been maintained as an outpatient; including Paxil 40 mg daily,  without adverse effect.   PAST PSYCHIATRIC HISTORY:  [name] was admitted to the [facility] in February 2006.  She was experiencing anxiety  and depression.  At that time she was also drinking two to three drinks  a week; and was taking Xanax that she had obtained from her brother.  The patient has carried the diagnosis of bipolar disorder in the past.  She has previous episodes of depression.   At the [facility] in February 2006 the patient  responded to trazodone for sleep.  She underwent a detoxification with a  Librium protocol.  She was started on Zoloft 25 mg daily.  She was also  started on Wellbutrin 150 mg XL daily, to be titrated to 300 mg daily.  She was also started on lithium 900 mg per day.  During the psychiatric  hospitalization at [facility] in February of 2006; her history of cyclic mood  was also confirmed with periods of staying in bed with no energy, no  motivation, and anhedonia; would then alternate with periods of  decreased sleep, and racing thoughts.   FAMILY PSYCHIATRIC HISTORY:  None known.   SOCIAL  HISTORY:  Divorced.  Her mother lives in the area; and is  supportive.  The patient lives by herself.   OCCUPATION:  Disabled.   GENERAL MEDICAL PROBLEMS:  Fibromyalgia, status post drug overdose.   MEDICATIONS:  The MAR is reviewed.  The patient is now restarted on her  Paxil.  She is receiving Valium 5 mg q.6 h. p.r.n.   ALLERGIES:  No known drug allergies.   LABORATORY DATA:  The WBC is 10.6, hemoglobin 11.7, platelet count 273.  Metabolic panel is unremarkable.  The SGOT is 16, SGPT 10, albumin  slightly decreased at 3.4, calcium is 8.4.   REVIEW OF SYSTEMS:  CONSTITUTIONAL:  Afebrile.  HEAD:  No trauma.  EYES:  No visual changes.  EARS:  No hearing impairment.  NOSE:  No rhinorrhea.  MOUTH AND THROAT:  No sore throat.  NEUROLOGIC:  As above.  PSYCHIATRIC:  As above.  CARDIOVASCULAR:  No chest pain, palpitations or edema.  GENITOURINARY:  No dysuria.  GASTROINTESTINAL:  No nausea, vomiting,  diarrhea.   SKIN:  Unremarkable.  MUSCULOSKELETAL:  No deformities,  weaknesses or atrophy.  ENDOCRINE/METABOLIC:  Unremarkable.  HEMATOLOGIC/LYMPHATIC:  Slight anemia.   PHYSICAL EXAMINATION:  VITAL SIGNS:  Afebrile; vital signs stable.   MENTAL STATUS EXAM:  [name] is a middle-aged female appearing her  stated age, sitting in a partially reclined supine position in her  hospital bed with good eye contact.  She is socially appropriate and  cooperative.  Her affect is constricted.  Her mood is depressed.  She  has clear psychomotor slowing.  She is oriented to all spheres.  Her  memory is intact to immediate recent remote except for the overdose  blackout.  Thought process is logical, coherent, and goal-directed.  No  looseness of associations.  Thought content:  There is no suicidal  ideation present.  She has no thoughts of harming others, no delusions,  no hallucinations.  She does have the thought of hopelessness.  She is  worried about her cravings and does want to get better.  Fund of  knowledge and intelligence are within normal limits.  Speech involves  normal rate and prosody without slurring or dysarthria.  Her insight is  partial; and her judgment is intact for the need of treatment.   ASSESSMENT:  AXIS I:  (Code 293.83) mood disorder not otherwise  specified, depressed (functional general medical and substance factors).  Rule out bipolar disorder not otherwise specified.  Polysubstance dependence.  AXIS II:  Deferred.  AXIS III:  See general medical problems.  AXIS IV:  General medical and primary support group.  AXIS V:  45.   [name] is not committable, but she certainly meets voluntary  inpatient criteria, due to the impairment of her mood condition upon  activities of daily living, as well as her dangerous substance addiction  pattern.  The undersigned recommended inpatient dual diagnosis psychiatric care and the patient agrees.   RECOMMENDATIONS:  1. When medically  cleared would admit to a psychiatric unit for dual      diagnosis treatment.  2. Defer changes in her psychotropic medication; however, it must be      emphasized that given the patient's history a mood stabilizer in      addition to an antidepressant is needed to be the primary back      bone of her psychotropic      regimen.  3. The 12-step method.  4. Milieu group psychotherapy including eventual 12-step  meetings.      Antonietta Breach, M.D.  Electronically Signed     JW/MEDQ  D:  02/19/2006  T:  02/19/2006  Job:  [id number]

## 2010-07-09 ENCOUNTER — Emergency Department (HOSPITAL_COMMUNITY): Payer: Medicaid Other

## 2010-07-09 ENCOUNTER — Emergency Department (HOSPITAL_COMMUNITY)
Admission: EM | Admit: 2010-07-09 | Discharge: 2010-07-09 | Disposition: A | Discharge status: Home / Self Care | Payer: Medicaid Other | Attending: Emergency Medicine | Admitting: Emergency Medicine

## 2010-07-09 DIAGNOSIS — M545 Low back pain, unspecified: Secondary | ICD-10-CM | POA: Insufficient documentation

## 2010-07-09 DIAGNOSIS — Y998 Other external cause status: Secondary | ICD-10-CM | POA: Insufficient documentation

## 2010-07-09 DIAGNOSIS — Y92009 Unspecified place in unspecified non-institutional (private) residence as the place of occurrence of the external cause: Secondary | ICD-10-CM | POA: Insufficient documentation

## 2010-07-09 DIAGNOSIS — Z79899 Other long term (current) drug therapy: Secondary | ICD-10-CM | POA: Insufficient documentation

## 2010-07-09 DIAGNOSIS — IMO0001 Reserved for inherently not codable concepts without codable children: Secondary | ICD-10-CM | POA: Insufficient documentation

## 2010-07-09 DIAGNOSIS — S139XXA Sprain of joints and ligaments of unspecified parts of neck, initial encounter: Secondary | ICD-10-CM | POA: Insufficient documentation

## 2010-07-09 DIAGNOSIS — M546 Pain in thoracic spine: Secondary | ICD-10-CM | POA: Insufficient documentation

## 2010-07-09 DIAGNOSIS — Z8673 Personal history of transient ischemic attack (TIA), and cerebral infarction without residual deficits: Secondary | ICD-10-CM | POA: Insufficient documentation

## 2010-07-09 DIAGNOSIS — W010XXA Fall on same level from slipping, tripping and stumbling without subsequent striking against object, initial encounter: Secondary | ICD-10-CM | POA: Insufficient documentation

## 2010-07-09 DIAGNOSIS — K219 Gastro-esophageal reflux disease without esophagitis: Secondary | ICD-10-CM | POA: Insufficient documentation

## 2010-07-09 DIAGNOSIS — F319 Bipolar disorder, unspecified: Secondary | ICD-10-CM | POA: Insufficient documentation

## 2010-07-09 DIAGNOSIS — R209 Unspecified disturbances of skin sensation: Secondary | ICD-10-CM | POA: Insufficient documentation

## 2010-07-09 LAB — CBC
HCT: 40.1 % (ref 36.0–46.0)
Hemoglobin: 13.5 g/dL (ref 12.0–15.0)
RDW: 13.6 % (ref 11.5–15.5)
WBC: 7.5 10*3/uL (ref 4.0–10.5)

## 2010-07-09 LAB — DIFFERENTIAL
Basophils Absolute: 0 10*3/uL (ref 0.0–0.1)
Eosinophils Relative: 2 % (ref 0–5)
Lymphocytes Relative: 28 % (ref 12–46)
Neutro Abs: 4.8 10*3/uL (ref 1.7–7.7)

## 2010-07-09 LAB — POCT I-STAT, CHEM 8
HCT: 43 % (ref 36.0–46.0)
Hemoglobin: 14.6 g/dL (ref 12.0–15.0)
Potassium: 4.4 mEq/L (ref 3.5–5.1)
Sodium: 136 mEq/L (ref 135–145)

## 2010-08-05 ENCOUNTER — Emergency Department (HOSPITAL_COMMUNITY)
Admission: EM | Admit: 2010-08-05 | Discharge: 2010-08-05 | Disposition: A | Discharge status: Home / Self Care | Payer: Medicaid Other | Attending: Emergency Medicine | Admitting: Emergency Medicine

## 2010-08-05 DIAGNOSIS — R059 Cough, unspecified: Secondary | ICD-10-CM | POA: Insufficient documentation

## 2010-08-05 DIAGNOSIS — R599 Enlarged lymph nodes, unspecified: Secondary | ICD-10-CM | POA: Insufficient documentation

## 2010-08-05 DIAGNOSIS — R05 Cough: Secondary | ICD-10-CM | POA: Insufficient documentation

## 2010-08-05 DIAGNOSIS — F17200 Nicotine dependence, unspecified, uncomplicated: Secondary | ICD-10-CM | POA: Insufficient documentation

## 2010-08-05 DIAGNOSIS — M25569 Pain in unspecified knee: Secondary | ICD-10-CM | POA: Insufficient documentation

## 2010-08-05 DIAGNOSIS — R509 Fever, unspecified: Secondary | ICD-10-CM | POA: Insufficient documentation

## 2010-08-05 DIAGNOSIS — M79609 Pain in unspecified limb: Secondary | ICD-10-CM | POA: Insufficient documentation

## 2010-08-05 DIAGNOSIS — J029 Acute pharyngitis, unspecified: Secondary | ICD-10-CM | POA: Insufficient documentation

## 2010-10-27 DIAGNOSIS — Z0271 Encounter for disability determination: Secondary | ICD-10-CM

## 2010-11-03 LAB — CBC
Hemoglobin: 14.4
MCHC: 34.8
RBC: 4.54
WBC: 7.2

## 2010-11-03 LAB — URINALYSIS, ROUTINE W REFLEX MICROSCOPIC
Hgb urine dipstick: NEGATIVE
Specific Gravity, Urine: 1.019
Urobilinogen, UA: 0.2

## 2010-11-03 LAB — URINE MICROSCOPIC-ADD ON

## 2010-11-03 LAB — POCT I-STAT, CHEM 8
BUN: 7
Calcium, Ion: 1.14
Chloride: 104
Creatinine, Ser: 1
Glucose, Bld: 97

## 2010-11-03 LAB — DIFFERENTIAL
Lymphocytes Relative: 25
Lymphs Abs: 1.8
Monocytes Absolute: 0.5
Monocytes Relative: 8
Neutro Abs: 4.6

## 2010-11-03 LAB — URINE CULTURE

## 2011-03-22 ENCOUNTER — Other Ambulatory Visit: Payer: Self-pay | Admitting: Thoracic Surgery

## 2011-03-22 DIAGNOSIS — K228 Other specified diseases of esophagus: Secondary | ICD-10-CM

## 2011-03-31 ENCOUNTER — Ambulatory Visit (INDEPENDENT_AMBULATORY_CARE_PROVIDER_SITE_OTHER): Payer: Medicaid Other | Admitting: Thoracic Surgery

## 2011-03-31 ENCOUNTER — Encounter: Payer: Self-pay | Admitting: Thoracic Surgery

## 2011-03-31 ENCOUNTER — Ambulatory Visit
Admission: RE | Admit: 2011-03-31 | Discharge: 2011-03-31 | Disposition: A | Discharge status: Home / Self Care | Payer: Medicaid Other | Source: Ambulatory Visit | Attending: Thoracic Surgery | Admitting: Thoracic Surgery

## 2011-03-31 VITALS — BP 137/81 | HR 86 | Resp 18 | Ht 64.0 in | Wt 112.0 lb

## 2011-03-31 DIAGNOSIS — K228 Other specified diseases of esophagus: Secondary | ICD-10-CM

## 2011-03-31 DIAGNOSIS — J984 Other disorders of lung: Secondary | ICD-10-CM

## 2011-03-31 NOTE — Progress Notes (Signed)
HPI patient returns to complain of pain in the left breast. 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 I think the pain is neurogenic in origin. I have recommended that she see the pain clinic again which made the region once referral. The is small cyst could not be a cause of the pain her lungs are clear attestation percussion  Current Outpatient Prescriptions  Medication Sig Dispense Refill  . albuterol (PROVENTIL HFA;VENTOLIN HFA) 108 (90 BASE) MCG/ACT inhaler Inhale 2 puffs into the lungs every 6 (six) hours as needed.      . buprenorphine-naloxone (SUBOXONE) 8-2 MG SUBL Place under the tongue 3 (three) times daily.        . Esterified Estrogens (MENEST) 1.25 MG TABS Take by mouth. The first through the twenty fifth on every month       . medroxyPROGESTERone (PROVERA) 10 MG tablet Take 10 mg by mouth daily.        Marland Kitchen PARoxetine (PAXIL) 20 MG tablet Take 60 mg by mouth every morning.       . tiotropium (SPIRIVA) 18 MCG inhalation capsule Place 18 mcg into inhaler and inhale daily.       Current Facility-Administered Medications  Medication Dose Route Frequency Provider Last Rate Last Dose  . 0.9 %  sodium chloride infusion  500 mL Intravenous Continuous Sheryn Bison, MD         Review of Systems: Unchanged   Physical Exam lungs clear attestation percussion   Diagnostic Tests: CT scan shows no evidence of enlargement of the small bronchogenic cyst   Impression: Left chest wall pain questionable small bronchogenic cyst   Plan: Return as needed

## 2012-03-08 ENCOUNTER — Other Ambulatory Visit: Payer: Self-pay | Admitting: Family Medicine

## 2012-03-08 DIAGNOSIS — N6323 Unspecified lump in the left breast, lower outer quadrant: Secondary | ICD-10-CM

## 2012-03-08 DIAGNOSIS — R921 Mammographic calcification found on diagnostic imaging of breast: Secondary | ICD-10-CM

## 2012-03-08 DIAGNOSIS — N644 Mastodynia: Secondary | ICD-10-CM

## 2012-06-27 ENCOUNTER — Encounter (HOSPITAL_COMMUNITY): Payer: Self-pay

## 2012-06-27 ENCOUNTER — Emergency Department (HOSPITAL_COMMUNITY)
Admission: EM | Admit: 2012-06-27 | Discharge: 2012-06-28 | Disposition: A | Discharge status: Home Health | Payer: Medicaid Other | Attending: Emergency Medicine | Admitting: Emergency Medicine

## 2012-06-27 DIAGNOSIS — J449 Chronic obstructive pulmonary disease, unspecified: Secondary | ICD-10-CM | POA: Insufficient documentation

## 2012-06-27 DIAGNOSIS — F411 Generalized anxiety disorder: Secondary | ICD-10-CM | POA: Insufficient documentation

## 2012-06-27 DIAGNOSIS — F17200 Nicotine dependence, unspecified, uncomplicated: Secondary | ICD-10-CM | POA: Insufficient documentation

## 2012-06-27 DIAGNOSIS — IMO0001 Reserved for inherently not codable concepts without codable children: Secondary | ICD-10-CM | POA: Insufficient documentation

## 2012-06-27 DIAGNOSIS — Z3202 Encounter for pregnancy test, result negative: Secondary | ICD-10-CM | POA: Insufficient documentation

## 2012-06-27 DIAGNOSIS — Z8739 Personal history of other diseases of the musculoskeletal system and connective tissue: Secondary | ICD-10-CM | POA: Insufficient documentation

## 2012-06-27 DIAGNOSIS — J4489 Other specified chronic obstructive pulmonary disease: Secondary | ICD-10-CM | POA: Insufficient documentation

## 2012-06-27 DIAGNOSIS — Z79899 Other long term (current) drug therapy: Secondary | ICD-10-CM | POA: Insufficient documentation

## 2012-06-27 DIAGNOSIS — F319 Bipolar disorder, unspecified: Secondary | ICD-10-CM | POA: Insufficient documentation

## 2012-06-27 DIAGNOSIS — Z8659 Personal history of other mental and behavioral disorders: Secondary | ICD-10-CM | POA: Insufficient documentation

## 2012-06-27 DIAGNOSIS — Z8673 Personal history of transient ischemic attack (TIA), and cerebral infarction without residual deficits: Secondary | ICD-10-CM | POA: Insufficient documentation

## 2012-06-27 DIAGNOSIS — G479 Sleep disorder, unspecified: Secondary | ICD-10-CM | POA: Insufficient documentation

## 2012-06-27 DIAGNOSIS — F1010 Alcohol abuse, uncomplicated: Secondary | ICD-10-CM | POA: Insufficient documentation

## 2012-06-27 DIAGNOSIS — Z8719 Personal history of other diseases of the digestive system: Secondary | ICD-10-CM | POA: Insufficient documentation

## 2012-06-27 LAB — COMPREHENSIVE METABOLIC PANEL
AST: 26 U/L (ref 0–37)
Albumin: 3.6 g/dL (ref 3.5–5.2)
Alkaline Phosphatase: 76 U/L (ref 39–117)
BUN: 10 mg/dL (ref 6–23)
CO2: 29 mEq/L (ref 19–32)
Chloride: 102 mEq/L (ref 96–112)
GFR calc non Af Amer: >90 mL/min (ref >90)
Potassium: 4.4 mEq/L (ref 3.5–5.1)
Total Bilirubin: 0.2 mg/dL — ABNORMAL LOW (ref 0.3–1.2)

## 2012-06-27 LAB — CBC
HCT: 46.1 % — ABNORMAL HIGH (ref 36.0–46.0)
Platelets: 293 10*3/uL (ref 150–400)
RBC: 4.92 MIL/uL (ref 3.87–5.11)
RDW: 13.3 % (ref 11.5–15.5)
WBC: 9.9 10*3/uL (ref 4.0–10.5)

## 2012-06-27 LAB — RAPID URINE DRUG SCREEN, HOSP PERFORMED
Amphetamines: NOT DETECTED
Barbiturates: NOT DETECTED
Benzodiazepines: POSITIVE — AB

## 2012-06-27 LAB — ACETAMINOPHEN LEVEL: Acetaminophen (Tylenol), Serum: <15 ug/mL (ref 10–30)

## 2012-06-27 LAB — POCT PREGNANCY, URINE: Preg Test, Ur: NEGATIVE

## 2012-06-27 MED ORDER — LORAZEPAM 2 MG/ML IJ SOLN: 1.0000 mg | Freq: Four times a day (QID) | INTRAMUSCULAR | Status: DC | PRN

## 2012-06-27 MED ORDER — ACETAMINOPHEN 325 MG PO TABS: 650.0000 mg | ORAL_TABLET | ORAL | Status: DC | PRN

## 2012-06-27 MED ORDER — THIAMINE HCL 100 MG/ML IJ SOLN: 100.0000 mg | Freq: Every day | INTRAMUSCULAR | Status: DC

## 2012-06-27 MED ORDER — ADULT MULTIVITAMIN W/MINERALS CH
1.0000 | ORAL_TABLET | Freq: Every day | ORAL | Status: DC
  Administered 2012-06-27: 1 via ORAL
  Filled 2012-06-27: qty 1

## 2012-06-27 MED ORDER — LORAZEPAM 1 MG PO TABS: 1.0000 mg | ORAL_TABLET | Freq: Three times a day (TID) | ORAL | Status: DC | PRN

## 2012-06-27 MED ORDER — FOLIC ACID 1 MG PO TABS
1.0000 mg | ORAL_TABLET | Freq: Every day | ORAL | Status: DC
  Administered 2012-06-27: 1 mg via ORAL
  Filled 2012-06-27: qty 1

## 2012-06-27 MED ORDER — ZOLPIDEM TARTRATE 5 MG PO TABS
5.0000 mg | ORAL_TABLET | Freq: Every evening | ORAL | Status: DC | PRN
  Administered 2012-06-27: 5 mg via ORAL
  Filled 2012-06-27: qty 1

## 2012-06-27 MED ORDER — ONDANSETRON HCL 4 MG PO TABS: 4.0000 mg | ORAL_TABLET | Freq: Three times a day (TID) | ORAL | Status: DC | PRN

## 2012-06-27 MED ORDER — VITAMIN B-1 100 MG PO TABS
100.0000 mg | ORAL_TABLET | Freq: Every day | ORAL | Status: DC
  Administered 2012-06-27: 100 mg via ORAL
  Filled 2012-06-27: qty 1

## 2012-06-27 MED ORDER — LORAZEPAM 1 MG PO TABS
1.0000 mg | ORAL_TABLET | Freq: Four times a day (QID) | ORAL | Status: DC | PRN
  Administered 2012-06-27 (×2): 1 mg via ORAL
  Filled 2012-06-27: qty 2
  Filled 2012-06-27: qty 1

## 2012-06-27 MED ORDER — IBUPROFEN 400 MG PO TABS: 600.0000 mg | ORAL_TABLET | Freq: Three times a day (TID) | ORAL | Status: DC | PRN

## 2012-06-27 MED ORDER — NICOTINE 21 MG/24HR TD PT24
21.0000 mg | MEDICATED_PATCH | Freq: Every day | TRANSDERMAL | Status: DC
  Administered 2012-06-27: 21 mg via TRANSDERMAL
  Filled 2012-06-27: qty 1
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
## 2012-06-27 NOTE — ED Notes (Signed)
Patient ambulatory to restroom. Provided urine sample. Requesting something to help her rest. Informed patient that she could receive an [place] later this evening when it's closer to bedtime. Patient agreeable. No other needs assessed at this time. Will continue to monitor.

## 2012-06-27 NOTE — ED Notes (Signed)
telepsych ready in patient's room

## 2012-06-27 NOTE — ED Notes (Signed)
Pt reporting that she is wanting to leave and sign herself out.  RN informed PA about pt wanting to leave AMA.  PA stated pt was not medically clear nor safe to go home as she reported suicidal thoughts.  PA stated she would start working on IVC papers on pt.

## 2012-06-27 NOTE — ED Notes (Signed)
Patient with telepysch MD

## 2012-06-27 NOTE — ED Notes (Signed)
Pt. Drinks a 5th of Vodka daily for few years.   Pt. Has stopped taking her psych medications over 1 month ago.   Pt. Has been drinking today. And told her significant other she wants help to stop drinking.  Pt. Is very tearful.  Denies any suicidal thoughts.

## 2012-06-27 NOTE — ED Notes (Signed)
Pt ambulatory to bathroom

## 2012-06-27 NOTE — ED Notes (Signed)
Pt.has been changed into blue scrubs, and has been wanded

## 2012-06-27 NOTE — ED Provider Notes (Signed)
History     CSN: [id number]  Arrival date & time 06/27/12  1424   First MD Initiated Contact with Patient 06/27/12 1527      Chief Complaint  Patient presents with  . Suicidal    (Consider location/radiation/quality/duration/timing/severity/associated sxs/prior treatment) HPI  Pt is a 57 yo F PMHx significant for Bipolar 1, depression, anxiety, ETOH abuse presenting to the ED for suicidal ideations that she has expressed to the nursing staff. Pt states she "is ready for it to be over" but does not a plan. Patient has been hospitalized three times in the past for suicide attempts all with drug over dose. Pt reports "drinking like a fish" unwilling to quantify amount. Pt also reports she has not been taknig her psych medications for over a month d/t hopelessness. No HI, auditory or visual hallucination. Denies fevers, chills, CP, SOB, abdominal pain.   Past Medical History  Diagnosis Date  . Substance abuse   . Bipolar 1 disorder   . Depression   . Fibromyalgia   . COPD (chronic obstructive pulmonary disease)   . Arthritis   . Anxiety   . GERD (gastroesophageal reflux disease)   . Stroke   . Hiatal hernia     Past Surgical History  Procedure Laterality Date  . Laser gum sx    . Appendectomy    . Tubal ligation    . Back sx    . Cervical fusion    . Tubaligation    . Ovary surgery      Family History  Problem Relation Age of Onset  . Heart disease Mother   . Diabetes Father   . Heart disease Father     History  Substance Use Topics  . Smoking status: Current Every Day Smoker -- 1.00 packs/day    Types: Cigarettes  . Smokeless tobacco: Not on file  . Alcohol Use: 0.0 oz/week    1-2 drink(s) per week     Comment: 5th of vodka daily    OB History   Grav Para Term Preterm Abortions TAB SAB Ect Mult Living                  Review of Systems  Constitutional: Negative for fever and chills.  Respiratory: Negative for shortness of breath.   Cardiovascular:  Negative for chest pain.  Gastrointestinal: Negative for abdominal distention.  Neurological: Negative for headaches.  Psychiatric/Behavioral: Positive for suicidal ideas and sleep disturbance. Negative for hallucinations. The patient is nervous/anxious.   All other systems reviewed and are negative.    Allergies  Review of patient's allergies indicates no known allergies.  Home Medications   Current Outpatient Rx  Name  Route  Sig  Dispense  Refill  . albuterol (PROVENTIL HFA;VENTOLIN HFA) 108 (90 BASE) MCG/ACT inhaler   Inhalation   Inhale 2 puffs into the lungs every 6 (six) hours as needed.         . tiotropium (SPIRIVA) 18 MCG inhalation capsule   Inhalation   Place 18 mcg into inhaler and inhale daily.           BP 182/109  Pulse 85  Temp(Src) 97.3 F (36.3 C) (Oral)  Resp 20  Wt 89 lb (40.37 kg)  BMI 15.27 kg/m2  SpO2 96%  LMP 02/26/2011  Physical Exam  Constitutional: She is oriented to person, place, and time. She appears well-developed and well-nourished. No distress.  HENT:  Head: Normocephalic and atraumatic.  Eyes: Conjunctivae are normal.  Neck: Neck supple.  Cardiovascular: Normal rate, regular rhythm and normal heart sounds.   Pulmonary/Chest: Effort normal and breath sounds normal. No respiratory distress.  Abdominal: Soft. There is no tenderness.  Musculoskeletal: She exhibits no edema.  Neurological: She is alert and oriented to person, place, and time.  Skin: Skin is warm and dry. She is not diaphoretic.  Psychiatric: Her speech is normal. She exhibits a depressed mood. She expresses suicidal ideation. She expresses no homicidal ideation. She expresses no homicidal plans.    ED Course  Procedures (including critical care time)  Medications  LORazepam (ATIVAN) tablet 1 mg (1 mg Oral Given 06/27/12 2346)    Or  LORazepam (ATIVAN) injection 1 mg ( Intravenous See Alternative 06/27/12 2346)  thiamine (VITAMIN B-1) tablet 100 mg (100 mg Oral  Given 06/27/12 1852)    Or  thiamine (B-1) injection 100 mg ( Intravenous See Alternative 06/27/12 1852)  folic acid (FOLVITE) tablet 1 mg (1 mg Oral Given 06/27/12 1852)  multivitamin with minerals tablet 1 tablet (1 tablet Oral Given 06/27/12 1852)  zolpidem (AMBIEN) tablet 5 mg (5 mg Oral Given 06/27/12 2121)  ibuprofen (ADVIL,MOTRIN) tablet 600 mg (not administered)  acetaminophen (TYLENOL) tablet 650 mg (not administered)  LORazepam (ATIVAN) tablet 1 mg (not administered)  nicotine (NICODERM CQ - dosed in mg/24 hours) patch 21 mg (21 mg Transdermal Patch Applied 06/27/12 1852)  ondansetron (ZOFRAN) tablet 4 mg (not administered)     CIWA protcol in place. Psych hold orders placed.  Patient wished to leave ED prior to being cleared by psychiatry, patient IVC'd d/t presenting for suicidal ideations and past attempt. Patient endorsed suicidal ideations stating "If I have to stay here I will kill myself."   Labs Reviewed  CBC - Abnormal; Notable for the following:    Hemoglobin 16.1 (*)    HCT 46.1 (*)    All other components within normal limits  COMPREHENSIVE METABOLIC PANEL - Abnormal; Notable for the following:    Sodium 146 (*)    Glucose, Bld 100 (*)    Total Bilirubin 0.2 (*)    All other components within normal limits  ETHANOL - Abnormal; Notable for the following:    Alcohol, Ethyl (B) 159 (*)    All other components within normal limits  SALICYLATE LEVEL - Abnormal; Notable for the following:    Salicylate Lvl <2.0 (*)    All other components within normal limits  URINE RAPID DRUG SCREEN (HOSP PERFORMED) - Abnormal; Notable for the following:    Cocaine POSITIVE (*)    Benzodiazepines POSITIVE (*)    All other components within normal limits  ACETAMINOPHEN LEVEL  POCT PREGNANCY, URINE   ACT team and telepsych consulted.   No results found.   1. Alcohol abuse       MDM  Patient presents to the ER with a number of risk factors for suicide for example the patient  has a history of Depression, substance abuse, past suicide attempts. In addition the patient has a number of protective factors for example the patient does not appear to be psychotic, was brought in by her boyfriend. Patient requesting to leave prior to being evaluated by telepsych and ACT team. Pt IVC'd. Current Plan is to have patient be evaluated by ACT and telepsych for further assessment on whether or not to be placed inpatient. Labs reviewed, remarkable for ETOH use. CIWA protocol in place. Patient has been cleared to move to [facility] pending further assessment. Patient d/w with Dr. Manus Gunning, agrees with plan.  Jeannetta Ellis, PA-C 06/28/12 [phone]

## 2012-06-27 NOTE — ED Notes (Signed)
Security and [employer] outside of room.
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
## 2012-06-27 NOTE — ED Notes (Signed)
[employer] has arrived to serve the patient IVC papers

## 2012-06-27 NOTE — ED Notes (Signed)
Pt significant other who brought her took all of pt belonging home with him.

## 2012-06-28 NOTE — BH Assessment (Signed)
[facility] Assessment Progress Note   Patient received a telepsychiatry consult from Dr. Jacky Kindle.  He recommended reversal of commitment and discharge with outpatient resources.  This was discussed with Dr. Dierdre Highman, who signed for release of commitment for patient.  Outpatient resources given to patient.

## 2012-06-28 NOTE — ED Provider Notes (Signed)
Medical screening examination/treatment/procedure(s) were conducted as a shared visit with non-physician practitioner(s) and myself.  I personally evaluated the patient during the encounter  Suicidal ideation without plan. Hx alcohol abuse.  No HI or hallucinations. IVC completed when she tried to leave after expressing desire to kill self.  Glynn Octave, MD 06/28/12 [phone]

## 2012-10-03 ENCOUNTER — Other Ambulatory Visit: Payer: Self-pay | Admitting: Family Medicine

## 2012-10-03 DIAGNOSIS — N644 Mastodynia: Secondary | ICD-10-CM

## 2012-10-03 DIAGNOSIS — R921 Mammographic calcification found on diagnostic imaging of breast: Secondary | ICD-10-CM

## 2012-10-03 DIAGNOSIS — N6323 Unspecified lump in the left breast, lower outer quadrant: Secondary | ICD-10-CM

## 2012-10-05 ENCOUNTER — Ambulatory Visit
Admission: RE | Admit: 2012-10-05 | Discharge: 2012-10-05 | Disposition: A | Discharge status: Home / Self Care | Payer: Medicaid Other | Source: Ambulatory Visit | Attending: Family Medicine | Admitting: Family Medicine

## 2012-10-05 ENCOUNTER — Other Ambulatory Visit: Payer: Self-pay | Admitting: Family Medicine

## 2012-10-05 DIAGNOSIS — N644 Mastodynia: Secondary | ICD-10-CM

## 2012-10-05 DIAGNOSIS — R921 Mammographic calcification found on diagnostic imaging of breast: Secondary | ICD-10-CM

## 2012-10-05 DIAGNOSIS — N6323 Unspecified lump in the left breast, lower outer quadrant: Secondary | ICD-10-CM

## 2012-11-06 ENCOUNTER — Ambulatory Visit
Admission: RE | Admit: 2012-11-06 | Discharge: 2012-11-06 | Disposition: A | Discharge status: Home / Self Care | Payer: Medicaid Other | Source: Ambulatory Visit | Attending: Family Medicine | Admitting: Family Medicine

## 2012-11-06 DIAGNOSIS — N644 Mastodynia: Secondary | ICD-10-CM

## 2012-11-06 DIAGNOSIS — N6323 Unspecified lump in the left breast, lower outer quadrant: Secondary | ICD-10-CM

## 2012-11-06 DIAGNOSIS — R921 Mammographic calcification found on diagnostic imaging of breast: Secondary | ICD-10-CM

## 2013-03-07 ENCOUNTER — Other Ambulatory Visit (HOSPITAL_COMMUNITY): Payer: Medicaid Other

## 2013-03-07 ENCOUNTER — Emergency Department (HOSPITAL_COMMUNITY): Payer: Medicaid Other

## 2013-03-07 ENCOUNTER — Inpatient Hospital Stay (HOSPITAL_COMMUNITY)
Admission: EM | Admit: 2013-03-07 | Discharge: 2013-03-08 | DRG: 894 | Discharge status: Against Medical Advice | Payer: Medicaid Other | Attending: Internal Medicine | Admitting: Internal Medicine

## 2013-03-07 ENCOUNTER — Inpatient Hospital Stay (HOSPITAL_COMMUNITY): Payer: Medicaid Other

## 2013-03-07 ENCOUNTER — Encounter (HOSPITAL_COMMUNITY): Payer: Self-pay | Admitting: Emergency Medicine

## 2013-03-07 DIAGNOSIS — F3289 Other specified depressive episodes: Secondary | ICD-10-CM | POA: Diagnosis present

## 2013-03-07 DIAGNOSIS — F10231 Alcohol dependence with withdrawal delirium: Principal | ICD-10-CM

## 2013-03-07 DIAGNOSIS — R569 Unspecified convulsions: Secondary | ICD-10-CM

## 2013-03-07 DIAGNOSIS — E876 Hypokalemia: Secondary | ICD-10-CM | POA: Diagnosis present

## 2013-03-07 DIAGNOSIS — I498 Other specified cardiac arrhythmias: Secondary | ICD-10-CM | POA: Diagnosis present

## 2013-03-07 DIAGNOSIS — R636 Underweight: Secondary | ICD-10-CM | POA: Diagnosis present

## 2013-03-07 DIAGNOSIS — F10939 Alcohol use, unspecified with withdrawal, unspecified: Secondary | ICD-10-CM

## 2013-03-07 DIAGNOSIS — F319 Bipolar disorder, unspecified: Secondary | ICD-10-CM

## 2013-03-07 DIAGNOSIS — Z79899 Other long term (current) drug therapy: Secondary | ICD-10-CM

## 2013-03-07 DIAGNOSIS — F17200 Nicotine dependence, unspecified, uncomplicated: Secondary | ICD-10-CM | POA: Diagnosis present

## 2013-03-07 DIAGNOSIS — J449 Chronic obstructive pulmonary disease, unspecified: Secondary | ICD-10-CM

## 2013-03-07 DIAGNOSIS — Z7982 Long term (current) use of aspirin: Secondary | ICD-10-CM

## 2013-03-07 DIAGNOSIS — IMO0001 Reserved for inherently not codable concepts without codable children: Secondary | ICD-10-CM | POA: Diagnosis present

## 2013-03-07 DIAGNOSIS — J4489 Other specified chronic obstructive pulmonary disease: Secondary | ICD-10-CM

## 2013-03-07 DIAGNOSIS — E43 Unspecified severe protein-calorie malnutrition: Secondary | ICD-10-CM | POA: Insufficient documentation

## 2013-03-07 DIAGNOSIS — R030 Elevated blood-pressure reading, without diagnosis of hypertension: Secondary | ICD-10-CM | POA: Diagnosis present

## 2013-03-07 DIAGNOSIS — F329 Major depressive disorder, single episode, unspecified: Secondary | ICD-10-CM | POA: Diagnosis present

## 2013-03-07 DIAGNOSIS — F10931 Alcohol use, unspecified with withdrawal delirium: Principal | ICD-10-CM

## 2013-03-07 DIAGNOSIS — E41 Nutritional marasmus: Secondary | ICD-10-CM | POA: Diagnosis present

## 2013-03-07 DIAGNOSIS — F10239 Alcohol dependence with withdrawal, unspecified: Secondary | ICD-10-CM

## 2013-03-07 DIAGNOSIS — Z681 Body mass index (BMI) 19 or less, adult: Secondary | ICD-10-CM

## 2013-03-07 DIAGNOSIS — Z981 Arthrodesis status: Secondary | ICD-10-CM

## 2013-03-07 DIAGNOSIS — F1020 Alcohol dependence, uncomplicated: Secondary | ICD-10-CM | POA: Diagnosis present

## 2013-03-07 DIAGNOSIS — Z8673 Personal history of transient ischemic attack (TIA), and cerebral infarction without residual deficits: Secondary | ICD-10-CM

## 2013-03-07 HISTORY — DX: Alcohol abuse, uncomplicated: F10.10

## 2013-03-07 LAB — URINALYSIS, ROUTINE W REFLEX MICROSCOPIC
Glucose, UA: NEGATIVE mg/dL
KETONES UR: 15 mg/dL — AB
NITRITE: NEGATIVE
PH: 5.5 (ref 5.0–8.0)
PROTEIN: 100 mg/dL — AB
Specific Gravity, Urine: 1.025 (ref 1.005–1.030)
Urobilinogen, UA: 0.2 mg/dL (ref 0.0–1.0)

## 2013-03-07 LAB — CBC WITH DIFFERENTIAL/PLATELET
BASOS ABS: 0 10*3/uL (ref 0.0–0.1)
BASOS PCT: 0 % (ref 0–1)
BASOS PCT: 0 % (ref 0–1)
Basophils Absolute: 0 10*3/uL (ref 0.0–0.1)
EOS ABS: 0 10*3/uL (ref 0.0–0.7)
EOS ABS: 0 10*3/uL (ref 0.0–0.7)
EOS PCT: 0 % (ref 0–5)
Eosinophils Relative: 0 % (ref 0–5)
HCT: 44.1 % (ref 36.0–46.0)
HCT: 37 % (ref 36.0–46.0)
Hemoglobin: 15.5 g/dL — ABNORMAL HIGH (ref 12.0–15.0)
Hemoglobin: 12.8 g/dL (ref 12.0–15.0)
LYMPHS ABS: 1.5 10*3/uL (ref 0.7–4.0)
Lymphocytes Relative: 19 % (ref 12–46)
Lymphocytes Relative: 11 % — ABNORMAL LOW (ref 12–46)
Lymphs Abs: 1.2 10*3/uL (ref 0.7–4.0)
MCH: 34.1 pg — AB (ref 26.0–34.0)
MCH: 33.5 pg (ref 26.0–34.0)
MCHC: 35.1 g/dL (ref 30.0–36.0)
MCHC: 34.6 g/dL (ref 30.0–36.0)
MCV: 97.1 fL (ref 78.0–100.0)
MCV: 96.9 fL (ref 78.0–100.0)
MONOS PCT: 5 % (ref 3–12)
Monocytes Absolute: 0.3 10*3/uL (ref 0.1–1.0)
Monocytes Absolute: 0.6 10*3/uL (ref 0.1–1.0)
Monocytes Relative: 4 % (ref 3–12)
NEUTROS PCT: 76 % (ref 43–77)
NEUTROS PCT: 84 % — AB (ref 43–77)
Neutro Abs: 5.7 10*3/uL (ref 1.7–7.7)
Neutro Abs: 9.6 10*3/uL — ABNORMAL HIGH (ref 1.7–7.7)
PLATELETS: 267 10*3/uL (ref 150–400)
PLATELETS: 257 10*3/uL (ref 150–400)
RBC: 4.54 MIL/uL (ref 3.87–5.11)
RBC: 3.82 MIL/uL — ABNORMAL LOW (ref 3.87–5.11)
RDW: 14.9 % (ref 11.5–15.5)
RDW: 14.7 % (ref 11.5–15.5)
WBC: 7.6 10*3/uL (ref 4.0–10.5)
WBC: 11.3 10*3/uL — ABNORMAL HIGH (ref 4.0–10.5)

## 2013-03-07 LAB — BASIC METABOLIC PANEL
BUN: 16 mg/dL (ref 6–23)
CALCIUM: 9.4 mg/dL (ref 8.4–10.5)
CO2: 21 mEq/L (ref 19–32)
Chloride: 94 mEq/L — ABNORMAL LOW (ref 96–112)
Creatinine, Ser: 0.71 mg/dL (ref 0.50–1.10)
GLUCOSE: 116 mg/dL — AB (ref 70–99)
POTASSIUM: 3.6 meq/L — AB (ref 3.7–5.3)
SODIUM: 138 meq/L (ref 137–147)

## 2013-03-07 LAB — COMPREHENSIVE METABOLIC PANEL
ALBUMIN: 4.1 g/dL (ref 3.5–5.2)
ALT: 8 U/L (ref 0–35)
AST: 30 U/L (ref 0–37)
Alkaline Phosphatase: 90 U/L (ref 39–117)
BILIRUBIN TOTAL: 0.8 mg/dL (ref 0.3–1.2)
BUN: 14 mg/dL (ref 6–23)
CHLORIDE: 97 meq/L (ref 96–112)
CO2: 22 meq/L (ref 19–32)
CREATININE: 0.77 mg/dL (ref 0.50–1.10)
Calcium: 8.4 mg/dL (ref 8.4–10.5)
GFR calc Af Amer: >90 mL/min (ref >90)
Glucose, Bld: 99 mg/dL (ref 70–99)
POTASSIUM: 3 meq/L — AB (ref 3.7–5.3)
SODIUM: 137 meq/L (ref 137–147)
Total Protein: 8.2 g/dL (ref 6.0–8.3)

## 2013-03-07 LAB — MAGNESIUM: Magnesium: 1.6 mg/dL (ref 1.5–2.5)

## 2013-03-07 LAB — GLUCOSE, CAPILLARY
GLUCOSE-CAPILLARY: 110 mg/dL — AB (ref 70–99)
Glucose-Capillary: 104 mg/dL — ABNORMAL HIGH (ref 70–99)
Glucose-Capillary: 119 mg/dL — ABNORMAL HIGH (ref 70–99)

## 2013-03-07 LAB — MRSA PCR SCREENING: MRSA by PCR: NEGATIVE

## 2013-03-07 LAB — URINE MICROSCOPIC-ADD ON

## 2013-03-07 LAB — RAPID URINE DRUG SCREEN, HOSP PERFORMED
Amphetamines: NOT DETECTED
BARBITURATES: NOT DETECTED
BENZODIAZEPINES: POSITIVE — AB
Cocaine: NOT DETECTED
Opiates: NOT DETECTED
Tetrahydrocannabinol: NOT DETECTED

## 2013-03-07 LAB — PHOSPHORUS: PHOSPHORUS: 2 mg/dL — AB (ref 2.3–4.6)

## 2013-03-07 LAB — ETHANOL

## 2013-03-07 MED ORDER — CLONIDINE HCL 0.1 MG PO TABS
0.1000 mg | ORAL_TABLET | Freq: Three times a day (TID) | ORAL | Status: DC
  Administered 2013-03-07 – 2013-03-08 (×3): 0.1 mg via ORAL
  Filled 2013-03-07 (×5): qty 1

## 2013-03-07 MED ORDER — ALBUTEROL SULFATE (2.5 MG/3ML) 0.083% IN NEBU
2.5000 mg | INHALATION_SOLUTION | RESPIRATORY_TRACT | Status: DC
  Administered 2013-03-07: 2.5 mg via RESPIRATORY_TRACT
  Filled 2013-03-07: qty 3

## 2013-03-07 MED ORDER — FOLIC ACID 1 MG PO TABS
1.0000 mg | ORAL_TABLET | Freq: Every day | ORAL | Status: DC
  Filled 2013-03-07: qty 1

## 2013-03-07 MED ORDER — VITAMIN B-1 100 MG PO TABS
100.0000 mg | ORAL_TABLET | Freq: Every day | ORAL | Status: DC
  Filled 2013-03-07: qty 1

## 2013-03-07 MED ORDER — ONDANSETRON HCL 4 MG/2ML IJ SOLN: 4.0000 mg | Freq: Three times a day (TID) | INTRAMUSCULAR | Status: DC | PRN

## 2013-03-07 MED ORDER — LORAZEPAM 2 MG/ML IJ SOLN: 0.0000 mg | Freq: Two times a day (BID) | INTRAMUSCULAR | Status: DC

## 2013-03-07 MED ORDER — THIAMINE HCL 100 MG/ML IJ SOLN
100.0000 mg | Freq: Every day | INTRAMUSCULAR | Status: DC
  Filled 2013-03-07: qty 1

## 2013-03-07 MED ORDER — LORAZEPAM 2 MG/ML IJ SOLN
2.0000 mg | INTRAMUSCULAR | Status: DC | PRN
  Administered 2013-03-07: 2 mg via INTRAVENOUS
  Filled 2013-03-07 (×2): qty 1

## 2013-03-07 MED ORDER — PRIMIDONE 50 MG PO TABS
50.0000 mg | ORAL_TABLET | Freq: Every day | ORAL | Status: DC
  Administered 2013-03-07: 50 mg via ORAL
  Filled 2013-03-07 (×2): qty 1

## 2013-03-07 MED ORDER — LORAZEPAM 0.5 MG PO TABS: 1.0000 mg | ORAL_TABLET | Freq: Four times a day (QID) | ORAL | Status: DC | PRN

## 2013-03-07 MED ORDER — SODIUM CHLORIDE 0.9 % IV SOLN
INTRAVENOUS | Status: AC
  Administered 2013-03-07 (×2): via INTRAVENOUS

## 2013-03-07 MED ORDER — SODIUM CHLORIDE 0.9 % IJ SOLN
3.0000 mL | Freq: Two times a day (BID) | INTRAMUSCULAR | Status: DC
  Administered 2013-03-07: 3 mL via INTRAVENOUS

## 2013-03-07 MED ORDER — FOLIC ACID 1 MG PO TABS
1.0000 mg | ORAL_TABLET | Freq: Every day | ORAL | Status: DC
  Administered 2013-03-07 – 2013-03-08 (×2): 1 mg via ORAL
  Filled 2013-03-07 (×2): qty 1

## 2013-03-07 MED ORDER — THIAMINE HCL 100 MG/ML IJ SOLN: 100.0000 mg | Freq: Every day | INTRAMUSCULAR | Status: DC

## 2013-03-07 MED ORDER — LORAZEPAM 2 MG/ML IJ SOLN
INTRAMUSCULAR | Status: AC
  Administered 2013-03-07: 02:00:00 1 mg via INTRAVENOUS
  Filled 2013-03-07: qty 1

## 2013-03-07 MED ORDER — IPRATROPIUM BROMIDE 0.02 % IN SOLN
0.5000 mg | RESPIRATORY_TRACT | Status: DC
  Administered 2013-03-07: 0.5 mg via RESPIRATORY_TRACT
  Filled 2013-03-07: qty 2.5

## 2013-03-07 MED ORDER — ACETAMINOPHEN 325 MG PO TABS: 650.0000 mg | ORAL_TABLET | Freq: Four times a day (QID) | ORAL | Status: DC | PRN

## 2013-03-07 MED ORDER — NICOTINE 21 MG/24HR TD PT24
21.0000 mg | MEDICATED_PATCH | Freq: Every day | TRANSDERMAL | Status: DC
  Administered 2013-03-07 – 2013-03-08 (×2): 21 mg via TRANSDERMAL
  Filled 2013-03-07 (×2): qty 1

## 2013-03-07 MED ORDER — LORAZEPAM 2 MG/ML IJ SOLN: 1.0000 mg | Freq: Four times a day (QID) | INTRAMUSCULAR | Status: DC | PRN

## 2013-03-07 MED ORDER — LORAZEPAM 2 MG/ML IJ SOLN
1.0000 mg | INTRAMUSCULAR | Status: DC | PRN
  Administered 2013-03-07: 1 mg via INTRAVENOUS

## 2013-03-07 MED ORDER — VITAMIN B-1 100 MG PO TABS
100.0000 mg | ORAL_TABLET | Freq: Every day | ORAL | Status: DC
  Administered 2013-03-07 – 2013-03-08 (×2): 100 mg via ORAL
  Filled 2013-03-07 (×2): qty 1

## 2013-03-07 MED ORDER — LORAZEPAM 2 MG/ML IJ SOLN
0.0000 mg | Freq: Four times a day (QID) | INTRAMUSCULAR | Status: DC
  Administered 2013-03-07: 2 mg via INTRAVENOUS
  Administered 2013-03-07: 4 mg via INTRAVENOUS
  Administered 2013-03-07: 2 mg via INTRAVENOUS
  Administered 2013-03-08: 1 mg via INTRAVENOUS
  Filled 2013-03-07 (×3): qty 1

## 2013-03-07 MED ORDER — HYDRALAZINE HCL 20 MG/ML IJ SOLN
10.0000 mg | INTRAMUSCULAR | Status: DC | PRN
  Administered 2013-03-08: 10 mg via INTRAVENOUS
  Filled 2013-03-07: qty 1

## 2013-03-07 MED ORDER — LORAZEPAM 2 MG/ML IJ SOLN
1.0000 mg | Freq: Four times a day (QID) | INTRAMUSCULAR | Status: DC | PRN
  Filled 2013-03-07: qty 1

## 2013-03-07 MED ORDER — PAROXETINE HCL 30 MG PO TABS
30.0000 mg | ORAL_TABLET | Freq: Every day | ORAL | Status: DC
  Administered 2013-03-07 – 2013-03-08 (×2): 30 mg via ORAL
  Filled 2013-03-07 (×2): qty 1

## 2013-03-07 MED ORDER — POTASSIUM CHLORIDE CRYS ER 20 MEQ PO TBCR
40.0000 meq | EXTENDED_RELEASE_TABLET | Freq: Once | ORAL | Status: AC
  Administered 2013-03-07: 40 meq via ORAL
  Filled 2013-03-07: qty 2

## 2013-03-07 MED ORDER — ADULT MULTIVITAMIN W/MINERALS CH
1.0000 | ORAL_TABLET | Freq: Every day | ORAL | Status: DC
Start: 2013-03-07 — End: 2013-03-08
  Administered 2013-03-07 – 2013-03-08 (×2): 1 via ORAL
  Filled 2013-03-07 (×2): qty 1

## 2013-03-07 MED ORDER — ALBUTEROL SULFATE (2.5 MG/3ML) 0.083% IN NEBU: 2.5000 mg | INHALATION_SOLUTION | RESPIRATORY_TRACT | Status: DC | PRN

## 2013-03-07 MED ORDER — LORAZEPAM 2 MG/ML IJ SOLN
2.0000 mg | Freq: Once | INTRAMUSCULAR | Status: AC
  Administered 2013-03-07: 2 mg via INTRAVENOUS
  Filled 2013-03-07: qty 1

## 2013-03-07 MED ORDER — SODIUM CHLORIDE 0.9 % IV BOLUS (SEPSIS)
1000.0000 mL | Freq: Once | INTRAVENOUS | Status: AC
  Administered 2013-03-07: 1000 mL via INTRAVENOUS

## 2013-03-07 MED ORDER — ACETAMINOPHEN 650 MG RE SUPP: 650.0000 mg | Freq: Four times a day (QID) | RECTAL | Status: DC | PRN

## 2013-03-07 MED ORDER — LORAZEPAM 2 MG/ML IJ SOLN
1.0000 mg | Freq: Once | INTRAMUSCULAR | Status: AC
Start: 2013-03-07 — End: 2013-03-07
  Administered 2013-03-07: 1 mg via INTRAVENOUS

## 2013-03-07 MED ORDER — LORAZEPAM 2 MG/ML IJ SOLN
0.0000 mg | Freq: Four times a day (QID) | INTRAMUSCULAR | Status: DC
  Administered 2013-03-07: 2 mg via INTRAVENOUS
  Filled 2013-03-07: qty 1

## 2013-03-07 MED ORDER — ADULT MULTIVITAMIN W/MINERALS CH
1.0000 | ORAL_TABLET | Freq: Every day | ORAL | Status: DC
  Filled 2013-03-07: qty 1

## 2013-03-07 MED ORDER — ONDANSETRON HCL 4 MG PO TABS: 4.0000 mg | ORAL_TABLET | Freq: Four times a day (QID) | ORAL | Status: DC | PRN

## 2013-03-07 MED ORDER — ONDANSETRON HCL 4 MG/2ML IJ SOLN: 4.0000 mg | Freq: Four times a day (QID) | INTRAMUSCULAR | Status: DC | PRN

## 2013-03-07 NOTE — Progress Notes (Signed)
Utilization Review Completed.  

## 2013-03-07 NOTE — ED Notes (Signed)
Pt is attempting to use phone, calling family to come and get her. Pt unable to use phone, does not know how to use phone with instructions. Pt continues to see her brother whenever someone walks by the door and is hearing phone ringing when there are no phone rings.

## 2013-03-07 NOTE — ED Notes (Signed)
Pt lying in bed and went into grand mal seizure lasting about one minute. Dr. Kathrynn Humble at bedside. Pt suctioned and Dr. Kathrynn Humble ordered 1mg  of ativan. Seizure precautions initiated.

## 2013-03-07 NOTE — ED Notes (Signed)
Spoke to MD K after report called and stated that 2mg  more mg of ativan to help pt more calm.

## 2013-03-07 NOTE — ED Notes (Signed)
Called IV Team for PIV start.

## 2013-03-07 NOTE — Progress Notes (Signed)
EEG was attempted by two techs. Pt uncooperative, rolling around in bed, pulling at wires and IV lines.

## 2013-03-07 NOTE — ED Notes (Signed)
Neurology MD at bedside

## 2013-03-07 NOTE — ED Provider Notes (Signed)
CSN: [id number]     Arrival date & time 03/07/13  0000 History   First MD Initiated Contact with Patient 03/07/13 0002     Chief Complaint  Patient presents with  . Seizures   (Consider location/radiation/quality/duration/timing/severity/associated sxs/prior Treatment) HPI Comments: Pt comes in with cc of seizure. Pt has hx of COPD, fibromyalgia, remote use of cocaine and regular alcohol use - last use 4 days ago. Per EMS - pt's son / boyfriend called EMS as he witnessed patient having a seizure. Pt was confused upon their arrival. Pt denies any med changes, recent infection, trauma, n/v/f/c/substance abuse.  While we were getting lab draw, patient had a generalized tonic clonic seizure episode, which i witnessed. It lasted for 2 min. Ativan 1 mg iv given (by the time ativan was in the room, pt had stopped seizing).  Patient is a 58 y.o. female presenting with seizures. The history is provided by the patient.  Seizures   Past Medical History  Diagnosis Date  . Substance abuse   . Bipolar 1 disorder   . Depression   . Fibromyalgia   . COPD (chronic obstructive pulmonary disease)   . Arthritis   . Anxiety   . GERD (gastroesophageal reflux disease)   . Stroke   . Hiatal hernia   . ETOH abuse    Past Surgical History  Procedure Laterality Date  . Laser gum sx    . Appendectomy    . Tubal ligation    . Back sx    . Cervical fusion    . Tubaligation    . Ovary surgery     Family History  Problem Relation Age of Onset  . Heart disease Mother   . Diabetes Father   . Heart disease Father    History  Substance Use Topics  . Smoking status: Current Every Day Smoker -- 1.00 packs/day    Types: Cigarettes  . Smokeless tobacco: Not on file  . Alcohol Use: 0.0 oz/week    1-2 drink(s) per week     Comment: 5th of vodka daily   OB History   Grav Para Term Preterm Abortions TAB SAB Ect Mult Living                 Review of Systems  Constitutional: Negative for activity  change.  HENT: Negative for facial swelling.   Respiratory: Negative for cough, shortness of breath and wheezing.   Cardiovascular: Negative for chest pain.  Gastrointestinal: Negative for nausea, vomiting, abdominal pain, diarrhea, constipation, blood in stool and abdominal distention.  Genitourinary: Negative for hematuria and difficulty urinating.  Musculoskeletal: Negative for neck pain.  Skin: Negative for color change.  Neurological: Positive for seizures and headaches. Negative for speech difficulty.  Hematological: Does not bruise/bleed easily.  Psychiatric/Behavioral: Negative for hallucinations and confusion.  All other systems reviewed and are negative.    Allergies  Review of patient's allergies indicates no known allergies.  Home Medications   Current Outpatient Rx  Name  Route  Sig  Dispense  Refill  . albuterol (PROVENTIL HFA;VENTOLIN HFA) 108 (90 BASE) MCG/ACT inhaler   Inhalation   Inhale 2 puffs into the lungs every 6 (six) hours as needed.         Marland Kitchen aspirin 81 MG chewable tablet   Oral   Chew 81 mg by mouth daily.         Marland Kitchen estrogens, conjugated, (PREMARIN) 1.25 MG tablet   Oral   Take 1.25 mg by mouth daily.  Days 1 through 25         . medroxyPROGESTERone (PROVERA) 10 MG tablet   Oral   Take 10 mg by mouth daily. Days 16 through 25         . PARoxetine (PAXIL) 30 MG tablet   Oral   Take 30 mg by mouth daily.         . primidone (MYSOLINE) 50 MG tablet   Oral   Take 50 mg by mouth at bedtime.         Marland Kitchen tiotropium (SPIRIVA) 18 MCG inhalation capsule   Inhalation   Place 18 mcg into inhaler and inhale daily.          BP 171/119  Pulse 94  Temp(Src) 97.7 F (36.5 C) (Oral)  Resp 16  SpO2 100%  LMP 02/26/2011 Physical Exam  Nursing note and vitals reviewed. Constitutional: She is oriented to person, place, and time. She appears well-developed and well-nourished.  HENT:  Head: Normocephalic and atraumatic.  Eyes: EOM are  normal. Pupils are equal, round, and reactive to light.  Neck: Neck supple.  Cardiovascular: Normal rate, regular rhythm and normal heart sounds.   No murmur heard. Pulmonary/Chest: Effort normal. No respiratory distress.  Abdominal: Soft. She exhibits no distension. There is no tenderness. There is no rebound and no guarding.  Neurological: She is alert and oriented to person, place, and time. No cranial nerve deficit. Coordination normal.  Skin: Skin is warm and dry.    ED Course  Procedures (including critical care time) Labs Review Labs Reviewed  CBC WITH DIFFERENTIAL - Abnormal; Notable for the following:    Hemoglobin 15.5 (*)    MCH 34.1 (*)    All other components within normal limits  BASIC METABOLIC PANEL - Abnormal; Notable for the following:    Potassium 3.6 (*)    Chloride 94 (*)    Glucose, Bld 116 (*)    All other components within normal limits  PHOSPHORUS - Abnormal; Notable for the following:    Phosphorus 2.0 (*)    All other components within normal limits  URINALYSIS, ROUTINE W REFLEX MICROSCOPIC - Abnormal; Notable for the following:    Color, Urine AMBER (*)    APPearance CLOUDY (*)    Hgb urine dipstick MODERATE (*)    Bilirubin Urine SMALL (*)    Ketones, ur 15 (*)    Protein, ur 100 (*)    Leukocytes, UA TRACE (*)    All other components within normal limits  URINE MICROSCOPIC-ADD ON - Abnormal; Notable for the following:    Casts HYALINE CASTS (*)    All other components within normal limits  MAGNESIUM  ETHANOL  URINE RAPID DRUG SCREEN (HOSP PERFORMED)   Imaging Review Ct Head Wo Contrast  03/07/2013   CLINICAL DATA:  Seizure.  EXAM: CT HEAD WITHOUT CONTRAST  TECHNIQUE: Contiguous axial images were obtained from the base of the skull through the vertex without intravenous contrast.  COMPARISON:  07/09/2010  FINDINGS: Skull and Sinuses:No acute bony abnormality. Layering effusion in the right maxillary antrum.  Orbits: No acute abnormality.  Brain:  The mid portion of the scan is blurry secondary to patient motion, causing mild/moderate degradation at this level. Overall the study is diagnostic. No evidence of acute abnormality, such as acute infarction, hemorrhage, hydrocephalus, or mass lesion/mass effect.  IMPRESSION: 1. No evidence of acute intracranial disease. 2. Right maxillary sinus effusion.  Correlate for acute sinusitis.   Electronically Signed   By: Roderic Palau  Watts M.D.   On: 03/07/2013 02:44    EKG Interpretation   None       MDM   1. Seizure   2. Alcohol withdrawal     DDx: -Seizure disorder -Alcohol withdrawals / DT -Meningitis -Trauma -ICH -Electrolyte abnormality -Metabolic derangement -Stroke -Toxin induced seizures -Medication side effects -Hypoxia -Hypoglycemia   Pt comes in with cc of seizures. Pt has no known hx of seizures. Seems like there is hx of alcohol abuse. Pt states last use was 4 days ago. After the repeat seizure here - pt is noted to be bit more confused, even an hour after the seizure. She has some tremors, is tachycardic, and is aox2 self, and place. She is also noted to be more restless.  CIWA protocol started. Concerns mainly for alcohol withdrawal, though, Neurology has been consulted. Will admit.  Varney Biles, MD 03/07/13 0425

## 2013-03-07 NOTE — ED Notes (Signed)
Pt from home. Boyfriend witnessed pt starting to shake and had both arms drawn up to chest and was foaming at the mouth for approximately 5 minutes. Pt did not fall or hit her head while she was seizing. Boyfriend performed a few chest compressions on the pt because he thought she was going unconscious. Boyfriend states that pt has been drinking alcohol this evening though pt will not admit to it. Pt alert and oriented to self, birth date, and place. Pt reports being dizzy for past three days. Boyfriend reported to EMS that she has been on a drinking "binge" last few days.

## 2013-03-07 NOTE — Progress Notes (Signed)
INITIAL NUTRITION ASSESSMENT  DOCUMENTATION CODES Per approved criteria  -Severe malnutrition in the context of chronic illness -Underweight   INTERVENTION: 1.  Add Ensure Complete BID once PO diet advances 2.  Recommend continued monitoring of refeeding labs (magnesium, phosphorus and potassium) as diet advances; MD to replete as needed 3.  RD to follow patient care plan  NUTRITION DIAGNOSIS: Inadequate oral intake related to alcohol abuse as evidenced by caretaker report of usual intake.   Goal: Patient to meet >/= 90% of estimated nutrition needs.  Monitor:  Weight trends, lab trends, I/Os, PO diet advancement  Reason for Assessment: Malnutrition Screening Tool Risk  58 y.o. female  Admitting Dx: Seizure  ASSESSMENT: Patient with PMH of COPD, fibromyalgia, and substance abuse admitted on 2/4 with seizures. Per neurology note, suspect seizures related to ETOH withdrawal.  Patient was disoriented and not alert upon dietetic intern visit. Patient's caregiver at bedside. Per patient's caregiver, patient has a history with ETOH abuse; patient regularly skips meals and consumes small portions. Patient's caregiver stated that the patient believes she has gained 10 lbs in the past 2 weeks; however dietetic intern questions weight history accuracy. NPO diet order on 2/4 currently canceled with no new diet order. RD to follow diet advancement.   Potassium at 3.0 and phosphorus at 2.0 are low. Magnesium WNL  Nutrition Focused Physical Exam:  Subcutaneous Fat:  Orbital Region: WNL Upper Arm Region: moderate depletion Thoracic and Lumbar Region: N/A  Muscle:  Temple Region: mild depletion Clavicle Bone Region: moderate depletion Clavicle and Acromion Bone Region: severe depletion Scapular Bone Region: N/A Dorsal Hand: severe depletion Patellar Region: severe depletion  Anterior Thigh Region: severe depletion Posterior Calf Region: severe depletion  Edema: absent  Patient  meets criteria for severe malnutrition in the context of chronic illness as evidenced by </=75% energy intake for >/= 1 month and severe muscle mass depletion.   Height: Ht Readings from Last 1 Encounters:  03/07/13 5\' 6"  (1.676 m)    Weight: Wt Readings from Last 1 Encounters:  03/07/13 101 lb 6.6 oz (46 kg)    Ideal Body Weight: 130 lb (59 kg)  % Ideal Body Weight: 78%  Wt Readings from Last 10 Encounters:  03/07/13 101 lb 6.6 oz (46 kg)  06/27/12 89 lb (40.37 kg)  03/31/11 112 lb (50.803 kg)  05/06/10 110 lb (49.896 kg)  04/02/10 111 lb (50.349 kg)    Usual Body Weight: 110 lb (49.9 kg)  % Usual Body Weight: 92%  BMI:  Body mass index is 16.38 kg/(m^2).  Estimated Nutritional Needs: Kcal: 1500-1700 Protein: 75-85 grams Fluid: 1.5-1.7 L  Skin: Intact  Diet Order: Diet order not specified  EDUCATION NEEDS: -No education needs identified at this time   Intake/Output Summary (Last 24 hours) at 03/07/13 1129 Last data filed at 03/07/13 0900  Gross per 24 hour  Intake 276.25 ml  Output      0 ml  Net 276.25 ml    Last BM: PTA   Labs:   Recent Labs Lab 03/07/13 0127 03/07/13 0755  NA 138 137  K 3.6* 3.0*  CL 94* 97  CO2 21 22  BUN 16 14  CREATININE 0.71 0.77  CALCIUM 9.4 8.4  MG 1.6  --   PHOS 2.0*  --   GLUCOSE 116* 99    CBG (last 3)   Recent Labs  03/07/13 0823  GLUCAP 110*    Scheduled Meds: . folic acid  1 mg Oral Daily  .  LORazepam  0-4 mg Intravenous Q6H   Followed by  . [START ON 03/09/2013] LORazepam  0-4 mg Intravenous Q12H  . multivitamin with minerals  1 tablet Oral Daily  . PARoxetine  30 mg Oral Daily  . primidone  50 mg Oral QHS  . sodium chloride  3 mL Intravenous Q12H  . thiamine  100 mg Oral Daily    Continuous Infusions: . sodium chloride 75 mL/hr at 03/07/13 1027    Past Medical History  Diagnosis Date  . Substance abuse   . Bipolar 1 disorder   . Depression   . Fibromyalgia   . COPD (chronic  obstructive pulmonary disease)   . Arthritis   . Anxiety   . GERD (gastroesophageal reflux disease)   . Stroke   . Hiatal hernia   . ETOH abuse     Past Surgical History  Procedure Laterality Date  . Laser gum sx    . Appendectomy    . Tubal ligation    . Back sx    . Cervical fusion    . Tubaligation    . Ovary surgery      Claudell Kyle, Dietetic Intern Pager: [phone]  I agree with the above information and made appropriate revisions. Inda Coke MS, RD, LDN Pager: [phone] After-hours pager: [phone]

## 2013-03-07 NOTE — Progress Notes (Signed)
[facility] TEAM 1 - Stepdown/ICU TEAM Progress Note  BETTEJANE LEAVENS OIZ:[id number] DOB: [birth year] DOA: 03/07/2013 PCP: Leonard Downing, MD  Admit HPI / Brief Narrative: 58 y.o. female w/ history of alcoholism, COPD, and fibromyalgia who was brought to the ER after she had a tonic-clonic seizure as witnessed by her family. As per the ER physician patient was postictal and confused on arrival in the ER. In the ER patient had another seizure which lasted for around 2 minutes. It was tonic-clonic. Patient was given Ativan 2 mg IV. Patient had at least 10-15 minutes of postictal state. CT head was negative for any thing acute. On-call neurologist Dr. Doy Mince evaluated the patient and flet the patient's seizures were from alcohol withdrawal. Patient alcohol level was undetectable. Patient states her last drink was 4 days prior.  HPI/Subjective: Pt is agitated and somewhat confused, but alert and interactive.  She c/o feeling "bad all over" but denies cp, n/v, or abdom pain.    Assessment/Plan:  Seizure Most c/w EtOh withdrawal - family confirms pt had not been drinking for last 2-3 days at least due to feeling bad from a heavy menstrual period   EtOH withdrawal - Hx of EtOH abuse  Continue CIWA - add clonidine - thiamine + folate  Elevated BP w/ sinus tachycardia  Due to above - add clonidine - follow - cont CIWA  Hypokalemia Replace - check Mg and Phosphorous as well  COPD w/ ongoing tobacco abuse  Well compensated at present   Bipolar Well compensated at present   Fibromyalgia  Severe malnutrition in the context of chronic illnes / Underweight   Code Status: FULL Family Communication: spoke w/ mother and boyfriend at bedside w/ permission from patient  Disposition Plan: SDU  Consultants: Neurology  Procedures: none  Antibiotics: none  DVT prophylaxis: SCDs  Objective: Blood pressure 186/100, pulse 97, temperature 98 F (36.7 C), temperature source Oral, resp.  rate 19, height 5\' 6"  (1.676 m), weight 46 kg (101 lb 6.6 oz), last menstrual period 02/26/2011, SpO2 100.00%.  Intake/Output Summary (Last 24 hours) at 03/07/13 1549 Last data filed at 03/07/13 1400  Gross per 24 hour  Intake 851.25 ml  Output      0 ml  Net 851.25 ml   Exam: General: No acute respiratory distress Lungs: Clear to auscultation bilaterally without wheezes or crackles Cardiovascular: Tachy but regular without murmur gallop or rub normal S1 and S2 Abdomen: Nontender, nondistended, soft, bowel sounds positive, no rebound, no ascites, no appreciable mass Extremities: No significant cyanosis, clubbing, or edema bilateral lower extremities  Data Reviewed: Basic Metabolic Panel:  Recent Labs Lab 03/07/13 0127 03/07/13 0755  NA 138 137  K 3.6* 3.0*  CL 94* 97  CO2 21 22  GLUCOSE 116* 99  BUN 16 14  CREATININE 0.71 0.77  CALCIUM 9.4 8.4  MG 1.6  --   PHOS 2.0*  --    Liver Function Tests:  Recent Labs Lab 03/07/13 0755  AST 30  ALT 8  ALKPHOS 90  BILITOT 0.8  PROT 8.2  ALBUMIN 4.1   CBC:  Recent Labs Lab 03/07/13 0127 03/07/13 0755  WBC 7.6 11.3*  NEUTROABS 5.7 9.6*  HGB 15.5* 12.8  HCT 44.1 37.0  MCV 97.1 96.9  PLT 267 257   CBG:  Recent Labs Lab 03/07/13 0823 03/07/13 1257  GLUCAP 110* 104*    Recent Results (from the past 240 hour(s))  MRSA PCR SCREENING     Status: None  Collection Time    03/07/13  6:04 AM      Result Value Range Status   MRSA by PCR NEGATIVE  NEGATIVE Final   Comment:            The GeneXpert MRSA Assay (FDA     approved for NASAL specimens     only), is one component of a     comprehensive MRSA colonization     surveillance program. It is not     intended to diagnose MRSA     infection nor to guide or     monitor treatment for     MRSA infections.     Studies:  Recent x-ray studies have been reviewed in detail by the Attending Physician  Scheduled Meds:  Scheduled Meds: . folic acid  1 mg Oral  Daily  . LORazepam  0-4 mg Intravenous Q6H   Followed by  . [START ON 03/09/2013] LORazepam  0-4 mg Intravenous Q12H  . multivitamin with minerals  1 tablet Oral Daily  . PARoxetine  30 mg Oral Daily  . primidone  50 mg Oral QHS  . sodium chloride  3 mL Intravenous Q12H  . thiamine  100 mg Oral Daily    Time spent on care of this patient: 35 mins   [place]  [phone] Pager - Text Page per Shea Evans as per below:  On-Call/Text Page:      [email or web address]      password [email or web address]  If 7PM-7AM, please contact night-coverage [email or web address] Password [email or web address] 03/07/2013, 3:49 PM   LOS: 0 days

## 2013-03-07 NOTE — Progress Notes (Signed)
Subjective: No recurrence of seizure activity reported. Patient has remained confused and agitated.  Objective: Current vital signs: BP 160/85  Pulse 95  Temp(Src) 97.7 F (36.5 C) (Oral)  Resp 22  Ht 5\' 6"  (1.676 m)  Wt 46 kg (101 lb 6.6 oz)  BMI 16.38 kg/m2  SpO2 100%  LMP 02/26/2011  Neurologic Exam: Sedated with Ativan just prior to my evaluation. Patient is less agitated and prior to sedation. She does not follow any commands and is very lethargic. Pupils were equal and reacted normally to light. Extraocular movements were intact and full as well as conjugate on right and left lateral gaze. No facial weakness noted. Patient moved extremities equally with normal strength. Deep tendon reflexes were 2+ and brisk and symmetrical.  Medications: I have reviewed the patient's current medications.  Assessment/Plan: Probable alcohol withdrawal seizure as etiology for patient's seizure activity and subsequent admission. Patient has continued to require intermittent sedating medication per CIWA protocol.  Recommend obtaining routine EEG study and MRI of the brain when feasible. No anticonvulsant medication is indicated at this point. We will continue to follow this patient with you.  C.R. Nicole Kindred, MD [facility] [phone]  03/07/2013  1:02 PM

## 2013-03-07 NOTE — Consult Note (Signed)
Reason for Consult:Seizure Referring Physician: Kathrynn Humble  CC: Seizures  HPI: [name] is an 58 y.o. female who was brought in by EMS after having had a seizure at home.  It seems that she had a witnessed tonic-clonic seizure at home by her boyfriend.  He describes the patient starting to shake and had both arms drawn up to chest and was foaming at the mouth for approximately 5 minutes.  Patient was brought in by EMS.  While being evaluated in the ED the patient had another seizure-again described as generalized tonic-clonic.     The patient reports that she has had a seizure in the past about a year and a half ago.  There is no records of this in EMR.  Patient doe shave a history of alcohol abuse.  Her boyfriend reports that she has been drinking this evening.  The patient reports that she has not drank anything since the night before.      Past Medical History  Diagnosis Date  . Substance abuse   . Bipolar 1 disorder   . Depression   . Fibromyalgia   . COPD (chronic obstructive pulmonary disease)   . Arthritis   . Anxiety   . GERD (gastroesophageal reflux disease)   . Stroke   . Hiatal hernia   . ETOH abuse     Past Surgical History  Procedure Laterality Date  . Laser gum sx    . Appendectomy    . Tubal ligation    . Back sx    . Cervical fusion    . Tubaligation    . Ovary surgery      Family History  Problem Relation Age of Onset  . Heart disease Mother   . Diabetes Father   . Heart disease Father     Social History:  reports that she has been smoking Cigarettes.  She has been smoking about 1.00 pack per day. She does not have any smokeless tobacco history on file. She reports that she drinks alcohol. Her drug history is not on file.  She had a positive cocaine screen in May and reports that she smokes marijuana as well.    No Known Allergies  Medications: I have reviewed the patient's current medications. Prior to Admission:  Current outpatient  prescriptions: albuterol (PROVENTIL HFA;VENTOLIN HFA) 108 (90 BASE) MCG/ACT inhaler, Inhale 2 puffs into the lungs every 6 (six) hours as needed., Disp: , Rfl: ;   aspirin 81 MG chewable tablet, Chew 81 mg by mouth daily., Disp: , Rfl: ;  estrogens, conjugated, (PREMARIN) 1.25 MG tablet, Take 1.25 mg by mouth daily. Days 1 through 25, Disp: , Rfl:  medroxyPROGESTERone (PROVERA) 10 MG tablet, Take 10 mg by mouth daily. Days 16 through 25, Disp: , Rfl: ;   PARoxetine (PAXIL) 30 MG tablet, Take 30 mg by mouth daily., Disp: , Rfl: ;   primidone (MYSOLINE) 50 MG tablet, Take 50 mg by mouth at bedtime., Disp: , Rfl: ;   tiotropium (SPIRIVA) 18 MCG inhalation capsule, Place 18 mcg into inhaler and inhale daily., Disp: , Rfl:   ROS: History obtained from the patient  General ROS: negative for - chills, fatigue, fever, night sweats, weight gain or weight loss Psychological ROS: negative for - behavioral disorder, hallucinations, memory difficulties, mood swings or suicidal ideation Ophthalmic ROS: negative for - blurry vision, double vision, eye pain or loss of vision ENT ROS: negative for - epistaxis, nasal discharge, oral lesions, sore throat, tinnitus or vertigo Allergy  and Immunology ROS: negative for - hives or itchy/watery eyes Hematological and Lymphatic ROS: negative for - bleeding problems, bruising or swollen lymph nodes Endocrine ROS: negative for - galactorrhea, hair pattern changes, polydipsia/polyuria or temperature intolerance Respiratory ROS: negative for - cough, hemoptysis, shortness of breath or wheezing Cardiovascular ROS: negative for - chest pain, dyspnea on exertion, edema or irregular heartbeat Gastrointestinal ROS: negative for - abdominal pain, diarrhea, hematemesis, nausea/vomiting or stool incontinence Genito-Urinary ROS: negative for - dysuria, hematuria, incontinence or urinary frequency/urgency Musculoskeletal ROS: negative for - joint swelling or muscular  weakness Neurological ROS: as noted in HPI Dermatological ROS: negative for rash and skin lesion changes  Physical Examination: Blood pressure 171/119, pulse 123, temperature 97.7 F (36.5 C), temperature source Oral, resp. rate 16, last menstrual period 02/26/2011, SpO2 100.00%.  Neurologic Examination Mental Status: Alert, not fully oriented.  Answers all questions but not sure how accurate her answers are.  Speech fluent without evidence of aphasia.  Able to follow 3 step commands without difficulty.  Easily distracted.  Agitated. Cranial Nerves: II: Discs flat bilaterally; Visual fields grossly normal, pupils 57mm, equal, round,  Reactive to light and accommodation on the right.  Reaction sluggish on the left III,IV, VI: ptosis not present, extra-ocular motions intact bilaterally V,VII: smile symmetric, facial light touch sensation normal bilaterally VIII: hearing normal bilaterally IX,X: gag reflex present XI: bilateral shoulder shrug XII: midline tongue extension Motor: Right : Upper extremity   5/5    Left:     Upper extremity   5/5  Lower extremity   5/5     Lower extremity   5/5 Tone and bulk:normal tone throughout; no atrophy noted Sensory: Pinprick and light touch intact throughout, bilaterally Deep Tendon Reflexes: 2+ throughout with absent AJ's bilaterally Plantars: Patient has withdrawal with attempt to obtain a plantar response bilaterally Cerebellar: normal finger-to-nose and normal heel-to-shin test.  Tremor noted. Gait: Unable to test CV: pulses palpable throughout     Laboratory Studies:   Basic Metabolic Panel:  Recent Labs Lab 03/07/13 0127  NA 138  K 3.6*  CL 94*  CO2 21  GLUCOSE 116*  BUN 16  CREATININE 0.71  CALCIUM 9.4  MG 1.6  PHOS 2.0*    Liver Function Tests: No results found for this basename: AST, ALT, ALKPHOS, BILITOT, PROT, ALBUMIN,  in the last 168 hours No results found for this basename: LIPASE, AMYLASE,  in the last 168  hours No results found for this basename: AMMONIA,  in the last 168 hours  CBC:  Recent Labs Lab 03/07/13 0127  WBC 7.6  NEUTROABS 5.7  HGB 15.5*  HCT 44.1  MCV 97.1  PLT 267    Cardiac Enzymes: No results found for this basename: CKTOTAL, CKMB, CKMBINDEX, TROPONINI,  in the last 168 hours  BNP: No components found with this basename: POCBNP,   CBG: No results found for this basename: GLUCAP,  in the last 168 hours  Microbiology: Results for orders placed in visit on 62/83/66  HELICOBACTER PYLORI SCREEN-BIOPSY     Status: None   Collection Time    05/07/10 11:40 AM      Result Value Range Status   UREASE Negative  Negative Final    Coagulation Studies: No results found for this basename: LABPROT, INR,  in the last 72 hours  Urinalysis: No results found for this basename: COLORURINE, APPERANCEUR, LABSPEC, PHURINE, GLUCOSEU, HGBUR, BILIRUBINUR, KETONESUR, PROTEINUR, UROBILINOGEN, NITRITE, LEUKOCYTESUR,  in the last 168 hours  Lipid Panel:  No results found for this basename: chol, trig, hdl, cholhdl, vldl, ldlcalc    HgbA1C:  No results found for this basename: HGBA1C    Urine Drug Screen:     Component Value Date/Time   LABOPIA NONE DETECTED 06/27/2012 1944   COCAINSCRNUR POSITIVE* 06/27/2012 1944   LABBENZ POSITIVE* 06/27/2012 1944   AMPHETMU NONE DETECTED 06/27/2012 1944   THCU NONE DETECTED 06/27/2012 1944   LABBARB NONE DETECTED 06/27/2012 1944    Alcohol Level:  Recent Labs Lab 03/07/13 0127  ETH <11    Imaging: Ct Head Wo Contrast  03/07/2013   CLINICAL DATA:  Seizure.  EXAM: CT HEAD WITHOUT CONTRAST  TECHNIQUE: Contiguous axial images were obtained from the base of the skull through the vertex without intravenous contrast.  COMPARISON:  07/09/2010  FINDINGS: Skull and Sinuses:No acute bony abnormality. Layering effusion in the right maxillary antrum.  Orbits: No acute abnormality.  Brain: The mid portion of the scan is blurry secondary to patient  motion, causing mild/moderate degradation at this level. Overall the study is diagnostic. No evidence of acute abnormality, such as acute infarction, hemorrhage, hydrocephalus, or mass lesion/mass effect.  IMPRESSION: 1. No evidence of acute intracranial disease. 2. Right maxillary sinus effusion.  Correlate for acute sinusitis.   Electronically Signed   By: Jorje Guild M.D.   On: 03/07/2013 02:44     Assessment/Plan: 58 year old female presenting with seizures.  Patient with a history of alcohol abuse.  Current ETOH level <11.  Patient may very well be experiencing alcohol withdrawal seizures.  Neurological examination unremarkable.  Head CT reviewed and no acute changes noted.  Further work up recommended.  Recommendations: 1.  CIWA protocol 2.  Ativan prn.  Anticonvulsant therapy not indicated at this time. 3.  Seizure precautions 4.  EEG 5.  MRI of the brain with and without contrast 6.  UDS, magnesium, phosphorus   Alexis Goodell, MD [facility] [phone] 03/07/2013, 3:37 AM

## 2013-03-07 NOTE — ED Notes (Addendum)
Pt continues to be restless in bed, attempting to get out of bed, scooting to the end of the bed, and sitting up, pt states that she wants water even though she is informed that she cannot have water several times before. Pt was talking to son on phone about having to stay in hospital. Pt has three warm blankets on her but is still shivering.

## 2013-03-07 NOTE — ED Notes (Signed)
Admitting MD at bedside.

## 2013-03-07 NOTE — H&P (Signed)
[facility] History and Physical  BLAISE PALLADINO QMV:[id number] DOB: 04-Jul-1955 DOA: 03/07/2013  Referring physician: ER physician. PCP: Leonard Downing, MD   Chief Complaint: Seizures.  HPI: [name] is a 58 y.o. female history of alcoholism, COPD, fibromyalgia was brought to the ER patient was found to have a seizure as witnessed by patient's family. As per the ER physician patient was witnessed to have seizures tonic-clonic at her house and was postictal and confused on arrival in the ER. In the ER patient had another seizure which lasted for around 2 minutes as per the ER physician. It was tonic-clonic. Patient was given Ativan 2 mg IV. Patient had at least 10-15 minutes of postictal state. On my exam patient is presently alert oriented and follows commands and denies any headache or any weakness of the upper or lower extremities. CT head was negative for any thing acute. On-call neurologist Dr. Doy Mince has evaluated the patient feels patient's seizures are from alcohol withdrawal. Patient alcohol level is undetectable. Patient states her fasting was 4 days ago. Patient otherwise denies any chest pain shortness of breath nausea vomiting.   Review of Systems: As presented in the history of presenting illness, rest negative.  Past Medical History  Diagnosis Date  . Substance abuse   . Bipolar 1 disorder   . Depression   . Fibromyalgia   . COPD (chronic obstructive pulmonary disease)   . Arthritis   . Anxiety   . GERD (gastroesophageal reflux disease)   . Stroke   . Hiatal hernia   . ETOH abuse    Past Surgical History  Procedure Laterality Date  . Laser gum sx    . Appendectomy    . Tubal ligation    . Back sx    . Cervical fusion    . Tubaligation    . Ovary surgery     Social History:  reports that she has been smoking Cigarettes.  She has been smoking about 1.00 pack per day. She does not have any smokeless tobacco history on file. She reports that she  drinks alcohol. Her drug history is not on file. Where does patient live home. Can patient participate in ADLs? Yes.  No Known Allergies  Family History:  Family History  Problem Relation Age of Onset  . Heart disease Mother   . Diabetes Father   . Heart disease Father       Prior to Admission medications   Medication Sig Start Date End Date Taking? Authorizing Provider  albuterol (PROVENTIL HFA;VENTOLIN HFA) 108 (90 BASE) MCG/ACT inhaler Inhale 2 puffs into the lungs every 6 (six) hours as needed.   Yes Historical Provider, MD  aspirin 81 MG chewable tablet Chew 81 mg by mouth daily.   Yes Historical Provider, MD  estrogens, conjugated, (PREMARIN) 1.25 MG tablet Take 1.25 mg by mouth daily. Days 1 through 25   Yes Historical Provider, MD  medroxyPROGESTERone (PROVERA) 10 MG tablet Take 10 mg by mouth daily. Days 16 through 25   Yes Historical Provider, MD  PARoxetine (PAXIL) 30 MG tablet Take 30 mg by mouth daily.   Yes Historical Provider, MD  primidone (MYSOLINE) 50 MG tablet Take 50 mg by mouth at bedtime.   Yes Historical Provider, MD  tiotropium (SPIRIVA) 18 MCG inhalation capsule Place 18 mcg into inhaler and inhale daily.   Yes Historical Provider, MD    Physical Exam: Filed Vitals:   03/07/13 0015 03/07/13 0215 03/07/13 0342 03/07/13 0526  BP: 171/118  171/119    Pulse: 90 123 94   Temp: 97.7 F (36.5 C)     TempSrc: Oral     Resp: 11 16  16   SpO2: 100% 100%  100%     General:  Well developed and moderately nourished.  Eyes: Anicteric no pallor.  ENT: No discharge from ears eyes nose mouth.  Neck: No mass felt.  Cardiovascular: S1-S2 heard.  Respiratory: No rhonchi or crepitations.  Abdomen: Soft nontender bowel sounds present.  Skin: No rash.  Musculoskeletal: No edema.  Psychiatric: Appears normal.  Neurologic: Alert awake oriented to time place and person. Moves all extremities 5 x 5.  Labs on Admission:  Basic Metabolic Panel:  Recent  Labs Lab 03/07/13 0127  NA 138  K 3.6*  CL 94*  CO2 21  GLUCOSE 116*  BUN 16  CREATININE 0.71  CALCIUM 9.4  MG 1.6  PHOS 2.0*   Liver Function Tests: No results found for this basename: AST, ALT, ALKPHOS, BILITOT, PROT, ALBUMIN,  in the last 168 hours No results found for this basename: LIPASE, AMYLASE,  in the last 168 hours No results found for this basename: AMMONIA,  in the last 168 hours CBC:  Recent Labs Lab 03/07/13 0127  WBC 7.6  NEUTROABS 5.7  HGB 15.5*  HCT 44.1  MCV 97.1  PLT 267   Cardiac Enzymes: No results found for this basename: CKTOTAL, CKMB, CKMBINDEX, TROPONINI,  in the last 168 hours  BNP (last 3 results) No results found for this basename: PROBNP,  in the last 8760 hours CBG: No results found for this basename: GLUCAP,  in the last 168 hours  Radiological Exams on Admission: Ct Head Wo Contrast  03/07/2013   CLINICAL DATA:  Seizure.  EXAM: CT HEAD WITHOUT CONTRAST  TECHNIQUE: Contiguous axial images were obtained from the base of the skull through the vertex without intravenous contrast.  COMPARISON:  07/09/2010  FINDINGS: Skull and Sinuses:No acute bony abnormality. Layering effusion in the right maxillary antrum.  Orbits: No acute abnormality.  Brain: The mid portion of the scan is blurry secondary to patient motion, causing mild/moderate degradation at this level. Overall the study is diagnostic. No evidence of acute abnormality, such as acute infarction, hemorrhage, hydrocephalus, or mass lesion/mass effect.  IMPRESSION: 1. No evidence of acute intracranial disease. 2. Right maxillary sinus effusion.  Correlate for acute sinusitis.   Electronically Signed   By: Jorje Guild M.D.   On: 03/07/2013 02:44     Assessment/Plan Principal Problem:   Seizure Active Problems:   COPD   Alcohol withdrawal delirium   1. Seizures - at this time the neurologist feels seizures are secondary to alcohol withdrawal. I have ordered MRI brain with and without  contrast and EEG as recommended by neurologist. Patient is placed on Ativan for now for alcohol withdrawal. 2. Alcohol abuse - patient has been placed on alcohol withdrawal protocol and IV Ativan. Thiamine. Social worker consult. 3. Elevated blood pressure - closely follow blood pressure trends. May be secondary to alcohol withdrawal. I have placed patient on when necessary IV hydralazine for now for systolic blood pressure more than 160. 4. History of depression - continue present medications. 5. COPD - patient is not wheezing. Continue inhalers.  I have reviewed patient's charts and labs.  Code Status: Full code.  Family Communication: None.  Disposition Plan: Admit to inpatient.    Latresa Gasser N. [facility] Pager [phone].  If 7PM-7AM, please contact night-coverage [email or web address] Password [facility] 03/07/2013, 5:36  AM        

## 2013-03-08 ENCOUNTER — Inpatient Hospital Stay (HOSPITAL_COMMUNITY): Payer: Medicaid Other

## 2013-03-08 DIAGNOSIS — F319 Bipolar disorder, unspecified: Secondary | ICD-10-CM

## 2013-03-08 DIAGNOSIS — F10931 Alcohol use, unspecified with withdrawal delirium: Principal | ICD-10-CM

## 2013-03-08 DIAGNOSIS — F10231 Alcohol dependence with withdrawal delirium: Principal | ICD-10-CM

## 2013-03-08 LAB — GLUCOSE, CAPILLARY
GLUCOSE-CAPILLARY: 157 mg/dL — AB (ref 70–99)
Glucose-Capillary: 142 mg/dL — ABNORMAL HIGH (ref 70–99)
Glucose-Capillary: 169 mg/dL — ABNORMAL HIGH (ref 70–99)

## 2013-03-08 LAB — CBC
HEMATOCRIT: 32.7 % — AB (ref 36.0–46.0)
HEMOGLOBIN: 10.9 g/dL — AB (ref 12.0–15.0)
MCH: 32.7 pg (ref 26.0–34.0)
MCHC: 33.3 g/dL (ref 30.0–36.0)
MCV: 98.2 fL (ref 78.0–100.0)
Platelets: 198 10*3/uL (ref 150–400)
RBC: 3.33 MIL/uL — ABNORMAL LOW (ref 3.87–5.11)
RDW: 14.9 % (ref 11.5–15.5)
WBC: 6.4 10*3/uL (ref 4.0–10.5)

## 2013-03-08 LAB — COMPREHENSIVE METABOLIC PANEL
ALT: 8 U/L (ref 0–35)
AST: 25 U/L (ref 0–37)
Albumin: 3 g/dL — ABNORMAL LOW (ref 3.5–5.2)
Alkaline Phosphatase: 67 U/L (ref 39–117)
BILIRUBIN TOTAL: 0.6 mg/dL (ref 0.3–1.2)
BUN: 9 mg/dL (ref 6–23)
CO2: 20 mEq/L (ref 19–32)
CREATININE: 0.58 mg/dL (ref 0.50–1.10)
Calcium: 8.1 mg/dL — ABNORMAL LOW (ref 8.4–10.5)
Chloride: 106 mEq/L (ref 96–112)
GFR calc Af Amer: >90 mL/min (ref >90)
GFR calc non Af Amer: >90 mL/min (ref >90)
Glucose, Bld: 94 mg/dL (ref 70–99)
Potassium: 3.3 mEq/L — ABNORMAL LOW (ref 3.7–5.3)
Sodium: 142 mEq/L (ref 137–147)
Total Protein: 6.5 g/dL (ref 6.0–8.3)

## 2013-03-08 LAB — PHOSPHORUS: PHOSPHORUS: 2 mg/dL — AB (ref 2.3–4.6)

## 2013-03-08 LAB — MAGNESIUM: Magnesium: 1.8 mg/dL (ref 1.5–2.5)

## 2013-03-08 MED ORDER — K PHOS MONO-SOD PHOS DI & MONO 155-852-130 MG PO TABS
500.0000 mg | ORAL_TABLET | Freq: Two times a day (BID) | ORAL | Status: DC
  Filled 2013-03-08 (×2): qty 2

## 2013-03-08 MED ORDER — POTASSIUM CHLORIDE CRYS ER 20 MEQ PO TBCR: 40.0000 meq | EXTENDED_RELEASE_TABLET | ORAL | Status: DC

## 2013-03-08 MED ORDER — CLONIDINE HCL 0.1 MG/24HR TD PTWK: 0.1000 mg | MEDICATED_PATCH | TRANSDERMAL | Status: DC

## 2013-03-08 NOTE — Progress Notes (Signed)
Patient called nurse to bedside and states that she is leaving right now no matter what. Nurse explained to patient that she is not medically ready for discharge but patient insists that she is going home with her boyfriend who is visiting in the room. Dr. Wynelle Cleveland paged and made aware. Dr. Wynelle Cleveland came to unit to talk with patient but patient still insist on leaving right now. Patient signed AMA form and left under the care of the "boyfriend".

## 2013-03-08 NOTE — Progress Notes (Signed)
Subjective: Somnolent but easily aroused. Patient was slightly agitated when aroused. She's remained confused. No recurrence of seizure activity.  Objective: Current vital signs: BP 163/115  Pulse 92  Temp(Src) 97.4 F (36.3 C) (Axillary)  Resp 22  Ht 5\' 6"  (1.676 m)  Wt 46 kg (101 lb 6.6 oz)  BMI 16.38 kg/m2  SpO2 98%  LMP 02/26/2011  Neurologic Exam: Disoriented to time as well as place. No acute distress. Pupils and extraocular movements were normal. No facial weakness was noted. Patient moved extremities equally with normal strength throughout. She was moderately tremulous.  Medications: I have reviewed the patient's current medications.  Assessment/Plan: Generalized tonic-clonic seizure on presentation most likely secondary to acute alcohol withdrawal. She said no recurrence of seizure activity since admission, and has not required anticonvulsant medication.  Recommend continuing with alcohol withdrawal protocol as planned. MRI and EEG to be obtained when feasible.  C.R. Nicole Kindred, MD [facility] [phone]  03/08/2013  8:35 AM

## 2013-03-08 NOTE — Procedures (Signed)
ELECTROENCEPHALOGRAM REPORT  Patient: [name]       Room #: 7Y19 EEG No. ID: [id number] Age: 58 y.o.        Sex: female Referring Physician: Wynelle Cleveland Report Date:  03/08/2013        Interpreting Physician: Anthony Sar  History: FUSAYE WACHTEL is an 58 y.o. female with a history of polysubstance abuse including alcohol and cocaine, admitted following a generalized tonic-clonic seizure, likely secondary to acute alcohol withdrawal.  Indications for study:  Rule out new onset seizure disorder.  Technique: This is an 18 channel routine scalp EEG performed at the bedside with bipolar and monopolar montages arranged in accordance to the international 10/20 system of electrode placement.   Description: This EEG study was performed during wakefulness. Predominate background activity consists of low amplitude 1-2 Hz diffuse symmetrical continuous delta activity with superimposed low amplitude symmetrical beta activity which is most prominent in the frontal and central regions. Occasional runs of 9-10 Hz alpha rhythm were recorded from the posterior head regions. Photic stimulation produced minimal bilateral occipital driving response. Hyperventilation was not performed. No epileptiform discharges were recorded.  Interpretation: CT is abnormal with mild generalized nonspecific tenderness swelling of cerebral activity. The predominance of beta activity is likely secondary to benzodiazepine medication use for sedation in the setting of alcohol withdrawal. No evidence of an epileptic disorder was demonstrated.   Rush Farmer M.D. [facility] [phone]

## 2013-03-24 NOTE — Discharge Summary (Signed)
Physician Discharge Summary  [name]:[id number] DOB: Jun 11, 1955 DOA: 03/07/2013  PCP: Leonard Downing, MD  Admit date: 03/07/2013 Discharge date: 03/08/2013  Time spent: 30 minutes  Patient left AMA despite my advice to stay 24 hrs more.   Discharge Diagnoses:  Principal Problem:   Seizure Active Problems:   COPD   Alcohol withdrawal delirium   Protein-calorie malnutrition, severe   Discharge Condition: stable  Diet recommendation: heart healthy  Filed Weights   03/07/13 0602  Weight: 101 lb 6.6 oz (46 kg)    History of present illness:  58 y.o. female w/ history of alcoholism, COPD, and fibromyalgia who was brought to the ER after she had a tonic-clonic seizure as witnessed by her family. As per the ER physician patient was postictal and confused on arrival in the ER. In the ER patient had another seizure which lasted for around 2 minutes. It was tonic-clonic. Patient was given Ativan 2 mg IV. Patient had at least 10-15 minutes of postictal state. CT head was negative for any thing acute. On-call neurologist Dr. Doy Mince evaluated the patient and flet the patient's seizures were from alcohol withdrawal. Patient alcohol level was undetectable. Patient states her last drink was 4 days prior.  Hospital Course:  Seizure  Most c/w EtOh withdrawal - family confirms pt had not been drinking for last 2-3 days at least due to feeling bad from a heavy menstrual period   EtOH withdrawal - Hx of EtOH abuse  -- has been advised strictly to cut back on drinking if not stopping completely  Elevated BP w/ sinus tachycardia  Due to above -improved with clonidine   Hypokalemia  Replaced-  COPD w/ ongoing tobacco abuse  Well compensated at present   Bipolar  Well compensated at present   Fibromyalgia   Severe malnutrition in the context of chronic illnes / Underweight    Procedures: EEG 2/5 -is abnormal with mild generalized nonspecific tenderness swelling of  cerebral activity. The predominance of beta activity is likely secondary to benzodiazepine medication use for sedation in the setting of alcohol withdrawal. No evidence of an epileptic disorder was demonstrated.   Consultations:  Neuro  Discharge Exam: Filed Vitals:   03/08/13 1500  BP: 137/111  Pulse: 106  Temp: 98.2 F (36.8 C)  Resp: 20    General: AAo x 3, no distress Cardiovascular: RRR, no murmurs Respiratory: CTAb/l   Discharge Instructions  No prescriptions given    Medication List    ASK your doctor about these medications       albuterol 108 (90 BASE) MCG/ACT inhaler  Commonly known as:  PROVENTIL HFA;VENTOLIN HFA  Inhale 2 puffs into the lungs every 6 (six) hours as needed.     aspirin 81 MG chewable tablet  Chew 81 mg by mouth daily.     estrogens (conjugated) 1.25 MG tablet  Commonly known as:  PREMARIN  Take 1.25 mg by mouth daily. Days 1 through 25     medroxyPROGESTERone 10 MG tablet  Commonly known as:  PROVERA  Take 10 mg by mouth daily. Days 16 through 25     PARoxetine 30 MG tablet  Commonly known as:  PAXIL  Take 30 mg by mouth daily.     primidone 50 MG tablet  Commonly known as:  MYSOLINE  Take 50 mg by mouth at bedtime.     tiotropium 18 MCG inhalation capsule  Commonly known as:  SPIRIVA  Place 18 mcg into inhaler and inhale daily.  No Known Allergies    The results of significant diagnostics from this hospitalization (including imaging, microbiology, ancillary and laboratory) are listed below for reference.    Significant Diagnostic Studies: Ct Head Wo Contrast  03/07/2013   CLINICAL DATA:  Seizure.  EXAM: CT HEAD WITHOUT CONTRAST  TECHNIQUE: Contiguous axial images were obtained from the base of the skull through the vertex without intravenous contrast.  COMPARISON:  07/09/2010  FINDINGS: Skull and Sinuses:No acute bony abnormality. Layering effusion in the right maxillary antrum.  Orbits: No acute abnormality.  Brain:  The mid portion of the scan is blurry secondary to patient motion, causing mild/moderate degradation at this level. Overall the study is diagnostic. No evidence of acute abnormality, such as acute infarction, hemorrhage, hydrocephalus, or mass lesion/mass effect.  IMPRESSION: 1. No evidence of acute intracranial disease. 2. Right maxillary sinus effusion.  Correlate for acute sinusitis.   Electronically Signed   By: Jorje Guild M.D.   On: 03/07/2013 02:44    Microbiology: No results found for this or any previous visit (from the past 240 hour(s)).   Labs: Basic Metabolic Panel: No results found for this basename: NA, K, CL, CO2, GLUCOSE, BUN, CREATININE, CALCIUM, MG, PHOS,  in the last 168 hours Liver Function Tests: No results found for this basename: AST, ALT, ALKPHOS, BILITOT, PROT, ALBUMIN,  in the last 168 hours No results found for this basename: LIPASE, AMYLASE,  in the last 168 hours No results found for this basename: AMMONIA,  in the last 168 hours CBC: No results found for this basename: WBC, NEUTROABS, HGB, HCT, MCV, PLT,  in the last 168 hours Cardiac Enzymes: No results found for this basename: CKTOTAL, CKMB, CKMBINDEX, TROPONINI,  in the last 168 hours BNP: BNP (last 3 results) No results found for this basename: PROBNP,  in the last 8760 hours CBG: No results found for this basename: GLUCAP,  in the last 168 hours     Signed:  Debbe Odea, MD  [facility] 03/08/2013

## 2013-08-12 ENCOUNTER — Encounter (HOSPITAL_COMMUNITY): Payer: Self-pay | Admitting: Emergency Medicine

## 2013-08-12 ENCOUNTER — Emergency Department (HOSPITAL_COMMUNITY)
Admission: EM | Admit: 2013-08-12 | Discharge: 2013-08-13 | Disposition: A | Discharge status: Home / Self Care | Payer: Medicaid Other | Attending: Emergency Medicine | Admitting: Emergency Medicine

## 2013-08-12 DIAGNOSIS — M129 Arthropathy, unspecified: Secondary | ICD-10-CM | POA: Diagnosis not present

## 2013-08-12 DIAGNOSIS — K449 Diaphragmatic hernia without obstruction or gangrene: Secondary | ICD-10-CM | POA: Insufficient documentation

## 2013-08-12 DIAGNOSIS — F319 Bipolar disorder, unspecified: Secondary | ICD-10-CM | POA: Diagnosis not present

## 2013-08-12 DIAGNOSIS — F10929 Alcohol use, unspecified with intoxication, unspecified: Secondary | ICD-10-CM

## 2013-08-12 DIAGNOSIS — K219 Gastro-esophageal reflux disease without esophagitis: Secondary | ICD-10-CM | POA: Insufficient documentation

## 2013-08-12 DIAGNOSIS — F1010 Alcohol abuse, uncomplicated: Secondary | ICD-10-CM | POA: Diagnosis not present

## 2013-08-12 DIAGNOSIS — F1910 Other psychoactive substance abuse, uncomplicated: Secondary | ICD-10-CM | POA: Insufficient documentation

## 2013-08-12 DIAGNOSIS — F17200 Nicotine dependence, unspecified, uncomplicated: Secondary | ICD-10-CM | POA: Diagnosis not present

## 2013-08-12 DIAGNOSIS — J4489 Other specified chronic obstructive pulmonary disease: Secondary | ICD-10-CM | POA: Insufficient documentation

## 2013-08-12 DIAGNOSIS — F411 Generalized anxiety disorder: Secondary | ICD-10-CM | POA: Diagnosis not present

## 2013-08-12 DIAGNOSIS — Z8673 Personal history of transient ischemic attack (TIA), and cerebral infarction without residual deficits: Secondary | ICD-10-CM | POA: Insufficient documentation

## 2013-08-12 DIAGNOSIS — J449 Chronic obstructive pulmonary disease, unspecified: Secondary | ICD-10-CM | POA: Diagnosis not present

## 2013-08-12 DIAGNOSIS — F3289 Other specified depressive episodes: Secondary | ICD-10-CM | POA: Insufficient documentation

## 2013-08-12 DIAGNOSIS — F1020 Alcohol dependence, uncomplicated: Secondary | ICD-10-CM

## 2013-08-12 DIAGNOSIS — F329 Major depressive disorder, single episode, unspecified: Secondary | ICD-10-CM | POA: Diagnosis not present

## 2013-08-12 DIAGNOSIS — Z79899 Other long term (current) drug therapy: Secondary | ICD-10-CM | POA: Diagnosis not present

## 2013-08-12 DIAGNOSIS — IMO0001 Reserved for inherently not codable concepts without codable children: Secondary | ICD-10-CM | POA: Insufficient documentation

## 2013-08-12 DIAGNOSIS — R45851 Suicidal ideations: Secondary | ICD-10-CM | POA: Diagnosis present

## 2013-08-12 LAB — RAPID URINE DRUG SCREEN, HOSP PERFORMED
Amphetamines: NOT DETECTED
BARBITURATES: NOT DETECTED
BENZODIAZEPINES: NOT DETECTED
Cocaine: NOT DETECTED
OPIATES: NOT DETECTED
TETRAHYDROCANNABINOL: NOT DETECTED

## 2013-08-12 LAB — COMPREHENSIVE METABOLIC PANEL
ALBUMIN: 3.2 g/dL — AB (ref 3.5–5.2)
ALK PHOS: 96 U/L (ref 39–117)
ALT: 15 U/L (ref 0–35)
AST: 34 U/L (ref 0–37)
Anion gap: 17 — ABNORMAL HIGH (ref 5–15)
BUN: 10 mg/dL (ref 6–23)
CALCIUM: 9.3 mg/dL (ref 8.4–10.5)
CO2: 27 mEq/L (ref 19–32)
Chloride: 100 mEq/L (ref 96–112)
Creatinine, Ser: 0.89 mg/dL (ref 0.50–1.10)
GFR calc Af Amer: 83 mL/min — ABNORMAL LOW (ref >90)
GFR calc non Af Amer: 72 mL/min — ABNORMAL LOW (ref >90)
Glucose, Bld: 100 mg/dL — ABNORMAL HIGH (ref 70–99)
POTASSIUM: 4.5 meq/L (ref 3.7–5.3)
Sodium: 144 mEq/L (ref 137–147)
Total Bilirubin: <0.2 mg/dL — ABNORMAL LOW (ref 0.3–1.2)
Total Protein: 8.2 g/dL (ref 6.0–8.3)

## 2013-08-12 LAB — CBC
HCT: 39.5 % (ref 36.0–46.0)
HEMOGLOBIN: 13.2 g/dL (ref 12.0–15.0)
MCH: 32.2 pg (ref 26.0–34.0)
MCHC: 33.4 g/dL (ref 30.0–36.0)
MCV: 96.3 fL (ref 78.0–100.0)
PLATELETS: 409 10*3/uL — AB (ref 150–400)
RBC: 4.1 MIL/uL (ref 3.87–5.11)
RDW: 15.6 % — AB (ref 11.5–15.5)
WBC: 7.7 10*3/uL (ref 4.0–10.5)

## 2013-08-12 LAB — ETHANOL: Alcohol, Ethyl (B): 393 mg/dL — ABNORMAL HIGH (ref 0–11)

## 2013-08-12 LAB — PHENOBARBITAL LEVEL

## 2013-08-12 LAB — SALICYLATE LEVEL: Salicylate Lvl: <2 mg/dL — ABNORMAL LOW (ref 2.8–20.0)

## 2013-08-12 LAB — ACETAMINOPHEN LEVEL: Acetaminophen (Tylenol), Serum: <15 ug/mL (ref 10–30)

## 2013-08-12 MED ORDER — MEDROXYPROGESTERONE ACETATE 10 MG PO TABS
10.0000 mg | ORAL_TABLET | Freq: Every day | ORAL | Status: DC
  Administered 2013-08-13: 10 mg via ORAL
  Filled 2013-08-12 (×2): qty 1

## 2013-08-12 MED ORDER — PAROXETINE HCL 30 MG PO TABS
30.0000 mg | ORAL_TABLET | Freq: Every day | ORAL | Status: DC
  Administered 2013-08-13: 30 mg via ORAL
  Filled 2013-08-12 (×2): qty 1

## 2013-08-12 MED ORDER — ONDANSETRON 4 MG PO TBDP
4.0000 mg | ORAL_TABLET | Freq: Four times a day (QID) | ORAL | Status: DC | PRN
  Administered 2013-08-13: 4 mg via ORAL
  Filled 2013-08-12: qty 1

## 2013-08-12 MED ORDER — IBUPROFEN 200 MG PO TABS: 600.0000 mg | ORAL_TABLET | Freq: Three times a day (TID) | ORAL | Status: DC | PRN

## 2013-08-12 MED ORDER — ZOLPIDEM TARTRATE 5 MG PO TABS: 5.0000 mg | ORAL_TABLET | Freq: Every evening | ORAL | Status: DC | PRN

## 2013-08-12 MED ORDER — STERILE WATER FOR INJECTION IJ SOLN
INTRAMUSCULAR | Status: AC
  Administered 2013-08-12: 21:00:00
  Filled 2013-08-12: qty 10

## 2013-08-12 MED ORDER — THIAMINE HCL 100 MG/ML IJ SOLN
100.0000 mg | Freq: Once | INTRAMUSCULAR | Status: AC
  Administered 2013-08-12: 100 mg via INTRAMUSCULAR
  Filled 2013-08-12: qty 2

## 2013-08-12 MED ORDER — ALBUTEROL SULFATE HFA 108 (90 BASE) MCG/ACT IN AERS: 2.0000 | INHALATION_SPRAY | Freq: Four times a day (QID) | RESPIRATORY_TRACT | Status: DC | PRN

## 2013-08-12 MED ORDER — CHLORDIAZEPOXIDE HCL 25 MG PO CAPS: 25.0000 mg | ORAL_CAPSULE | Freq: Three times a day (TID) | ORAL | Status: DC

## 2013-08-12 MED ORDER — TIOTROPIUM BROMIDE MONOHYDRATE 18 MCG IN CAPS: 18.0000 ug | ORAL_CAPSULE | Freq: Every day | RESPIRATORY_TRACT | Status: DC

## 2013-08-12 MED ORDER — PRIMIDONE 50 MG PO TABS
50.0000 mg | ORAL_TABLET | Freq: Every day | ORAL | Status: DC
  Filled 2013-08-12 (×2): qty 1

## 2013-08-12 MED ORDER — LOPERAMIDE HCL 2 MG PO CAPS: 2.0000 mg | ORAL_CAPSULE | ORAL | Status: DC | PRN

## 2013-08-12 MED ORDER — ALUM & MAG HYDROXIDE-SIMETH 200-200-20 MG/5ML PO SUSP
30.0000 mL | ORAL | Status: DC | PRN
Start: 2013-08-12 — End: 2013-08-13

## 2013-08-12 MED ORDER — LORAZEPAM 1 MG PO TABS
1.0000 mg | ORAL_TABLET | Freq: Three times a day (TID) | ORAL | Status: DC | PRN
  Administered 2013-08-13: 1 mg via ORAL
  Filled 2013-08-12: qty 1

## 2013-08-12 MED ORDER — ADULT MULTIVITAMIN W/MINERALS CH
1.0000 | ORAL_TABLET | Freq: Every day | ORAL | Status: DC
  Administered 2013-08-13: 1 via ORAL
  Filled 2013-08-12: qty 1

## 2013-08-12 MED ORDER — ASPIRIN 81 MG PO CHEW
81.0000 mg | CHEWABLE_TABLET | Freq: Every day | ORAL | Status: DC
  Administered 2013-08-13: 81 mg via ORAL
  Filled 2013-08-12: qty 1

## 2013-08-12 MED ORDER — CHLORDIAZEPOXIDE HCL 25 MG PO CAPS: 25.0000 mg | ORAL_CAPSULE | ORAL | Status: DC

## 2013-08-12 MED ORDER — ESTROGENS CONJUGATED 1.25 MG PO TABS
1.2500 mg | ORAL_TABLET | Freq: Every day | ORAL | Status: DC
  Administered 2013-08-13: 1.25 mg via ORAL
  Filled 2013-08-12 (×2): qty 1

## 2013-08-12 MED ORDER — CHLORDIAZEPOXIDE HCL 25 MG PO CAPS: 50.0000 mg | ORAL_CAPSULE | Freq: Once | ORAL | Status: DC

## 2013-08-12 MED ORDER — CHLORDIAZEPOXIDE HCL 25 MG PO CAPS: 25.0000 mg | ORAL_CAPSULE | Freq: Every day | ORAL | Status: DC

## 2013-08-12 MED ORDER — ZIPRASIDONE MESYLATE 20 MG IM SOLR
20.0000 mg | Freq: Once | INTRAMUSCULAR | Status: AC
  Administered 2013-08-12: 20 mg via INTRAMUSCULAR
  Filled 2013-08-12: qty 20

## 2013-08-12 MED ORDER — ONDANSETRON HCL 4 MG PO TABS: 4.0000 mg | ORAL_TABLET | Freq: Three times a day (TID) | ORAL | Status: DC | PRN

## 2013-08-12 MED ORDER — CHLORDIAZEPOXIDE HCL 25 MG PO CAPS: 25.0000 mg | ORAL_CAPSULE | Freq: Four times a day (QID) | ORAL | Status: DC | PRN

## 2013-08-12 MED ORDER — NICOTINE 21 MG/24HR TD PT24
21.0000 mg | MEDICATED_PATCH | Freq: Every day | TRANSDERMAL | Status: DC
  Administered 2013-08-13: 21 mg via TRANSDERMAL
  Filled 2013-08-12: qty 1

## 2013-08-12 MED ORDER — HYDROXYZINE HCL 25 MG PO TABS: 25.0000 mg | ORAL_TABLET | Freq: Four times a day (QID) | ORAL | Status: DC | PRN

## 2013-08-12 MED ORDER — CHLORDIAZEPOXIDE HCL 25 MG PO CAPS
25.0000 mg | ORAL_CAPSULE | Freq: Four times a day (QID) | ORAL | Status: DC
  Administered 2013-08-13 (×2): 25 mg via ORAL
  Filled 2013-08-12 (×2): qty 1

## 2013-08-12 MED ORDER — VITAMIN B-1 100 MG PO TABS
100.0000 mg | ORAL_TABLET | Freq: Every day | ORAL | Status: DC
  Administered 2013-08-13: 100 mg via ORAL
  Filled 2013-08-12: qty 1

## 2013-08-12 MED ORDER — ACETAMINOPHEN 325 MG PO TABS: 650.0000 mg | ORAL_TABLET | ORAL | Status: DC | PRN

## 2013-08-12 NOTE — ED Notes (Signed)
Restraints removed per MD Jacubowitz. Patient calm and non verbally aggressive at this time.

## 2013-08-12 NOTE — BH Assessment (Signed)
Assessment completed. Consulted with Serena Colonel, NP who agrees that pt meets inpatient criteria. [facility] at capacity. TTS will seek placement at other facilities. Noland Fordyce, PA-C has been notified of the recommendation.

## 2013-08-12 NOTE — ED Provider Notes (Signed)
Medical screening examination/treatment/procedure(s) were conducted as a shared visit with non-physician practitioner(s) and myself.  I personally evaluated the patient during the encounter.   EKG Interpretation   Date/Time:  Sunday August 12 2013 20:25:13 EDT Ventricular Rate:  114 PR Interval:  126 QRS Duration: 95 QT Interval:  342 QTC Calculation: 471 R Axis:   -61 Text Interpretation:  Sinus tachycardia Left anterior fascicular block  SINCE LAST TRACING HEART RATE HAS INCREASED Confirmed by Winfred Leeds  MD,  Md Smola [phone]) on 08/12/2013 8:54:55 PM       Orlie Dakin, MD 08/12/13 2356

## 2013-08-12 NOTE — ED Notes (Signed)
Per MD-IVC paperwork has been submitted.
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
## 2013-08-12 NOTE — ED Notes (Signed)
Pt thrashing around in bed, screaming, and removing her hand restraints. Pt demanding for [place] to call her lawyer, boyfriend or son. Pt states that she wants to leave and we will be sued if we do not let her. Restraints reapplied, explained to pt that she was not able to leave at this time, that she would not be able to make phone calls or have visitors until 9 am. MD and RN made aware.

## 2013-08-12 NOTE — Consult Note (Signed)
Sundai Probert is 58 yo female who presented to [facility] Emergency Department today after calling the police and telling them she is an alcoholic and she wanted to die.  Patient reports she drinks a half gallon of Vodka daily and has been doing so for the past 2 years. Patient's BAL 393.  Agree with TTS assessment. Inpatient detox recommended. No beds available at [facility]. TTS will seek placement at alternate facilities.  Plan: Monitor for safety and stabilization until an inpatient bed is found. Continue home medications. Start Librium detox protocol.  Serena Colonel, FNP-BC [facility]   I agreed with the findings, treatment and disposition plan of this patient. Berniece Andreas, MD

## 2013-08-12 NOTE — ED Provider Notes (Signed)
CSN: [id number]     Arrival date & time 08/12/13  1723 History  This chart was scribed for Noland Fordyce, PA-C, working with Orlie Dakin, MD by Girtha Hake, ED Scribe. The patient was seen in [place]. The patient's care was started at 6:14 PM.     Chief Complaint  Patient presents with  . Suicidal   . Alcohol Intoxication    HPI HPI Comments: [name] is a 58 y.o. female with a history of substance abuse (alcohol and heroine) who presents to the Emergency Department complaining of alcohol intoxication. Patient reports that she has been drinking vodka today, but is unsure of how much. She states that she is a daily drinker. Patient denies suicidal or homicidal ideations at this time, however triage note states pt called police today and told them she wanted to die.  Pt does not have a specific plain. Patient has gone through alcohol detox before, at [facility], but does not remember when. Patient reports a history of seizures and strokes from alcohol withdrawal. She does not know when her last stroke or seizure was.  Pt also reports hx of bipolar disorder. Medical records indicate hx of substance abuse, depression, fibromyalgia, COPD, anxiety, and GERD.  Reports taking all her daily medication as prescribed.  Denies pain at this time. Denies SOB, fever, n/v/d.   Son reports that the patient lives with him. He is unable to monitor her completely because he works 40 hrs/week. He states that she drinks at least a quarter gallon of vodka per day. He states that her drinking has worsened since the death of her husband. He reports that the patient has vocalized that she "wants to go to sleep and not wake up" for the past 2-3 months.   Patient currently takes Primidone 100 mg/day, Paroxetine HCl 60 mg/day, and Medroxyprogesterone 10 mg/day.   PCP is Dr. Arelia Sneddon.   Past Medical History  Diagnosis Date  . Substance abuse   . Bipolar 1 disorder   . Depression   . Fibromyalgia   .  COPD (chronic obstructive pulmonary disease)   . Arthritis   . Anxiety   . GERD (gastroesophageal reflux disease)   . Stroke   . Hiatal hernia   . ETOH abuse    Past Surgical History  Procedure Laterality Date  . Laser gum sx    . Appendectomy    . Tubal ligation    . Back sx    . Cervical fusion    . Tubaligation    . Ovary surgery     Family History  Problem Relation Age of Onset  . Heart disease Mother   . Diabetes Father   . Heart disease Father    History  Substance Use Topics  . Smoking status: Current Every Day Smoker -- 1.00 packs/day    Types: Cigarettes  . Smokeless tobacco: Not on file  . Alcohol Use: 0.0 oz/week    1-2 drink(s) per week     Comment: 5th of vodka daily   OB History   Grav Para Term Preterm Abortions TAB SAB Ect Mult Living                 Review of Systems  Constitutional:       Alcohol intoxication.  All other systems reviewed and are negative.     Allergies  Review of patient's allergies indicates no known allergies.  Home Medications   Prior to Admission medications   Medication Sig Start  Date End Date Taking? Authorizing Provider  estrogens, conjugated, (PREMARIN) 1.25 MG tablet Take 1.25 mg by mouth daily. Days 1 through 25   Yes Historical Provider, MD  ibuprofen (ADVIL,MOTRIN) 200 MG tablet Take 400 mg by mouth every 6 (six) hours as needed (pain).   Yes Historical Provider, MD  medroxyPROGESTERone (PROVERA) 10 MG tablet Take 10 mg by mouth daily. Days 16 through 25   Yes Historical Provider, MD  PARoxetine (PAXIL) 30 MG tablet Take 30 mg by mouth daily.   Yes Historical Provider, MD  primidone (MYSOLINE) 50 MG tablet Take 50 mg by mouth at bedtime.   Yes Historical Provider, MD   BP 141/94  Pulse 102  Temp(Src) 98.7 F (37.1 C) (Oral)  Resp 18  SpO2 97%  LMP 02/26/2011 Physical Exam  Nursing note and vitals reviewed. Constitutional: She appears well-developed and well-nourished. No distress.  Disheveled  appearing. tearful  HENT:  Head: Normocephalic and atraumatic.  Eyes: Conjunctivae are normal. No scleral icterus.  Neck: Normal range of motion.  Cardiovascular: Normal rate, regular rhythm and normal heart sounds.   Pulmonary/Chest: Effort normal and breath sounds normal. No respiratory distress. She has no wheezes. She has no rales. She exhibits no tenderness.  Abdominal: Soft. Bowel sounds are normal. She exhibits no distension and no mass. There is no tenderness. There is no rebound and no guarding.  Musculoskeletal: Normal range of motion.  Neurological: She is alert.  Skin: Skin is warm and dry. She is not diaphoretic.  Psychiatric: Her speech is slurred. She exhibits a depressed mood. She expresses no homicidal and no suicidal ideation.    ED Course  Procedures (including critical care time) DIAGNOSTIC STUDIES: Oxygen Saturation is 97% on room air, normal by my interpretation.    COORDINATION OF CARE:   Labs Review Labs Reviewed  CBC - Abnormal; Notable for the following:    RDW 15.6 (*)    Platelets 409 (*)    All other components within normal limits  COMPREHENSIVE METABOLIC PANEL - Abnormal; Notable for the following:    Glucose, Bld 100 (*)    Albumin 3.2 (*)    Total Bilirubin <0.2 (*)    GFR calc non Af Amer 72 (*)    GFR calc Af Amer 83 (*)    Anion gap 17 (*)    All other components within normal limits  ETHANOL - Abnormal; Notable for the following:    Alcohol, Ethyl (B) 393 (*)    All other components within normal limits  SALICYLATE LEVEL - Abnormal; Notable for the following:    Salicylate Lvl <1.8 (*)    All other components within normal limits  URINE RAPID DRUG SCREEN (HOSP PERFORMED)  ACETAMINOPHEN LEVEL    Imaging Review No results found.   EKG Interpretation None      MDM   Final diagnoses:  None    Pt is a 58yo female brought to ED by police with reports of SI w/o specific plan. Pt reports use of Etoh but unsure how much. Daily  drinker. Hx of seizures and strokes from withdrawal. Does not recall last seizure or stroke.  Pt tearful and appears depressed. Denies SI in triage. Will consult with TTS for proper placement of pt.  Psych hold orders placed.    8:19 PM Pt attempted to leave while still intoxicated. Consulted with Dr. Winfred Leeds, IVC forms filled out and pt restrained as she is a danger to herself.  Dr. Winfred Leeds taking over pt care.  I personally performed the services described in this documentation, which was scribed in my presence. The recorded information has been reviewed and is accurate.    Noland Fordyce, PA-C 08/12/13 2019

## 2013-08-12 NOTE — ED Notes (Signed)
Pt states that she is an alcoholic and wants to die.  Called the cops today and told them this.  States that she doesn't have a specific plan.  Very tearful in triage.  BIB son.

## 2013-08-12 NOTE — ED Provider Notes (Signed)
Patient is threatening, lunged at me. She will not ccoerative wiith questioning. She is angry and hostile. Point to leave the emergency department. She is able to ambulate unassisted. She was placed in a room placed in 4point restraints in order to prevent harm to herself and staff. 8:55 PM patient attempting to get out of restraints, remains combative. Geodon ordered 10:50 PM patient sleeping, arousable to verbal stimulus.. Moves all extremities. Did not follow simple commands Glasgow Coma Score 12. Leg restriants have been removed .  Involuntary commitment papers for psychiatric evaluation followed by me as patient wanted to leave the hospital and reportedly expressed suicidal ideation. She is intoxicated not of sound mind to leave the ED. Results for orders placed during the hospital encounter of 08/12/13  CBC      Result Value Ref Range   WBC 7.7  4.0 - 10.5 K/uL   RBC 4.10  3.87 - 5.11 MIL/uL   Hemoglobin 13.2  12.0 - 15.0 g/dL   HCT 39.5  36.0 - 46.0 %   MCV 96.3  78.0 - 100.0 fL   MCH 32.2  26.0 - 34.0 pg   MCHC 33.4  30.0 - 36.0 g/dL   RDW 15.6 (*) 11.5 - 15.5 %   Platelets 409 (*) 150 - 400 K/uL  COMPREHENSIVE METABOLIC PANEL      Result Value Ref Range   Sodium 144  137 - 147 mEq/L   Potassium 4.5  3.7 - 5.3 mEq/L   Chloride 100  96 - 112 mEq/L   CO2 27  19 - 32 mEq/L   Glucose, Bld 100 (*) 70 - 99 mg/dL   BUN 10  6 - 23 mg/dL   Creatinine, Ser 0.89  0.50 - 1.10 mg/dL   Calcium 9.3  8.4 - 10.5 mg/dL   Total Protein 8.2  6.0 - 8.3 g/dL   Albumin 3.2 (*) 3.5 - 5.2 g/dL   AST 34  0 - 37 U/L   ALT 15  0 - 35 U/L   Alkaline Phosphatase 96  39 - 117 U/L   Total Bilirubin <0.2 (*) 0.3 - 1.2 mg/dL   GFR calc non Af Amer 72 (*) >90 mL/min   GFR calc Af Amer 83 (*) >90 mL/min   Anion gap 17 (*) 5 - 15  ETHANOL      Result Value Ref Range   Alcohol, Ethyl (B) 393 (*) 0 - 11 mg/dL  URINE RAPID DRUG SCREEN (HOSP PERFORMED)      Result Value Ref Range   Opiates NONE DETECTED  NONE  DETECTED   Cocaine NONE DETECTED  NONE DETECTED   Benzodiazepines NONE DETECTED  NONE DETECTED   Amphetamines NONE DETECTED  NONE DETECTED   Tetrahydrocannabinol NONE DETECTED  NONE DETECTED   Barbiturates NONE DETECTED  NONE DETECTED  ACETAMINOPHEN LEVEL      Result Value Ref Range   Acetaminophen (Tylenol), Serum <15.0  10 - 30 ug/mL  SALICYLATE LEVEL      Result Value Ref Range   Salicylate Lvl <8.0 (*) 2.8 - 20.0 mg/dL   No results found.  Patient signed out to dr molpus at 11pm  Dx #1 acute alcohol intoxication #2 suicidal ideation. CRITICAL CARE Performed by: Orlie Dakin Total critical care time: 30 minute Critical care time was exclusive of separately billable procedures and treating other patients. Critical care was necessary to treat or prevent imminent or life-threatening deterioration. Critical care was time spent personally by me on the following activities: development  of treatment plan with patient and/or surrogate as well as nursing, discussions with consultants, evaluation of patient's response to treatment, examination of patient, obtaining history from patient or surrogate, ordering and performing treatments and interventions, ordering and review of laboratory studies, ordering and review of radiographic studies, pulse oximetry and re-evaluation of patient's condition.  Orlie Dakin, MD 08/12/13 [phone]

## 2013-08-13 ENCOUNTER — Encounter (HOSPITAL_COMMUNITY): Payer: Self-pay | Admitting: Registered Nurse

## 2013-08-13 LAB — ETHANOL: Alcohol, Ethyl (B): 41 mg/dL — ABNORMAL HIGH (ref 0–11)

## 2013-08-13 NOTE — [facility] Assessment (Signed)
Confirmed with Angela Nevin at [facility] that pt's information has been received and is currently under review for inpatient treatment bed placement.   Shaune Pollack, MS, [place] Assessment Counselor

## 2013-08-13 NOTE — [facility] Suicide Risk Assessment (Cosign Needed)
Suicide Risk Assessment  Discharge Assessment     Demographic Factors:  Caucasian and Female  Total Time spent with patient: 20 minutes Psychiatric Specialty Exam:      Blood pressure 157/94, pulse 103, temperature 99.2 F (37.3 C), temperature source Oral, resp. rate 18, last menstrual period 02/26/2011, SpO2 100.00%.There is no weight on file to calculate BMI.   General Appearance: Casual   Eye Contact:: Good   Speech: Clear and Coherent and Normal Rate   Volume: Normal   Mood: "I'm fine; see no shaking or nothing"   Affect: Congruent   Thought Process: Circumstantial   Orientation: Full (Time, Place, and Person)   Thought Content: Rumination   Suicidal Thoughts: No   Homicidal Thoughts: No   Memory: Immediate; Good  Recent; Good  Remote; Good   Judgement: Fair   Insight: Fair   Psychomotor Activity: Normal   Concentration: Fair   Recall: Good   Fund of Knowledge:Good   Language: Good   Akathisia: No   Handed: Right   AIMS (if indicated):   Assets: Communication Skills  Desire for Improvement  Housing  Social Support   Sleep:   Musculoskeletal:  Strength & Muscle Tone: within normal limits  Gait & Station: normal  Patient leans: N/A    Mental Status Per Nursing Assessment::   On Admission:     Current Mental Status by Physician: Patient denies suicidal/homicidal ideation, psychosis, and paranoia  Loss Factors: NA  Historical Factors: NA  Risk Reduction Factors:   Sense of responsibility to family and Positive social support  Continued Clinical Symptoms:  Alcohol/Substance Abuse/Dependencies  Cognitive Features That Contribute To Risk:  Closed-mindedness    Suicide Risk:  Minimal: No identifiable suicidal ideation.  Patients presenting with no risk factors but with morbid ruminations; may be classified as minimal risk based on the severity of the depressive symptoms  Discharge Diagnoses:  AXIS I: Alcohol Abuse, Substance Abuse and Substance  Induced Mood Disorder  AXIS II: Deferred  AXIS III:  Past Medical History   Diagnosis  Date   .  Substance abuse    .  Bipolar 1 disorder    .  Depression    .  Fibromyalgia    .  COPD (chronic obstructive pulmonary disease)    .  Arthritis    .  Anxiety    .  GERD (gastroesophageal reflux disease)    .  Stroke    .  Hiatal hernia    .  ETOH abuse     AXIS IV: other psychosocial or environmental problems  AXIS V: 61-70 mild symptoms   Plan Of Care/Follow-up recommendations:  Activity:  Resume usual activity Diet:  Resume usual diet  Is patient on multiple antipsychotic therapies at discharge:  No   Has Patient had three or more failed trials of antipsychotic monotherapy by history:  No  Recommended Plan for Multiple Antipsychotic Therapies: NA    Rankin, Shuvon, FNP-BC 08/13/2013, 4:57 PM

## 2013-08-13 NOTE — Discharge Instructions (Signed)
°Emergency Department Resource Guide °1) Find a Doctor and Pay Out of Pocket °Although you won't have to find out who is covered by your insurance plan, it is a good idea to ask around and get recommendations. You will then need to call the office and see if the doctor you have chosen will accept you as a new patient and what types of options they offer for patients who are self-pay. Some doctors offer discounts or will set up payment plans for their patients who do not have insurance, but you will need to ask so you aren't surprised when you get to your appointment. ° °2) Contact Your Local Health Department °Not all health departments have doctors that can see patients for sick visits, but many do, so it is worth a call to see if yours does. If you don't know where your local health department is, you can check in your phone book. The CDC also has a tool to help you locate your state's health department, and many state websites also have listings of all of their local health departments. ° °3) Find a Walk-in Clinic °If your illness is not likely to be very severe or complicated, you may want to try a walk in clinic. These are popping up all over the country in pharmacies, drugstores, and shopping centers. They're usually staffed by nurse practitioners or physician assistants that have been trained to treat common illnesses and complaints. They're usually fairly quick and inexpensive. However, if you have serious medical issues or chronic medical problems, these are probably not your best option. ° °No Primary Care Doctor: °- Call Health Connect at  832-8000 - they can help you locate a primary care doctor that  accepts your insurance, provides certain services, etc. °- Physician Referral Service- 1-800-533-3463 ° °Chronic Pain Problems: °Organization         Address  Phone   Notes  °[facility]  ([phone]) [phone] Patients need to be referred by their primary care doctor.  ° °Medication  Assistance: °Organization         Address  Phone   Notes  °[place] [facility] [address] °[place], [place] [address] (336) 641-8030 --Must be a resident of [place] °-- Must have NO insurance coverage whatsoever (no Medicaid/ Medicare, etc.) °-- The pt. MUST have a primary care doctor that directs their care regularly and follows them in the community °  °MedAssist  (866) 331-1348   °[employer]  (888) 892-1162   ° °Agencies that provide inexpensive medical care: °Organization         Address  Phone   Notes  °[facility]  (336) 832-8035   °[facility]    (336) 832-7272   °[facility] [address] °[place], [place] [address] (336) 832-4777   °[facility] [address], °[place] (336) 271-4999   °Planned Parenthood    (336) 373-0678   °[facility]    (336) 272-1050   °[facility] ° [address], [place] Phone:  (336) 832-4444, Fax:  (336) 832-4440 Hours of Operation:  9 am - 6 pm, M-F.  Also accepts Medicaid/Medicare and self-pay.  ° [facility] ° [address], [place] Phone: (336) [phone], Fax: (336) [phone]. Hours of Operation:  8:30 am - 5:30 pm, M-F.  Also accepts Medicaid and self-pay.  °[facility] [place] [address], [place] Phone: (336) 878-6027   °[facility] [address], [place], [place] (336)723-1848, Ext. 123 Mondays & Thursdays: 7-9 AM.  First 15 patients are seen on a first come, first serve basis. °  ° °Medicaid-accepting [place] Providers: ° °Organization         Address  Phone   Notes  °[facility] [address], [place] (336) [phone] Also accepts self-pay patients.  °[facility] [address], [place] ° (336) [phone]   °[facility] [address] [place] [address], [place]  (336) 288-8857   °[facility] [address] [place] [address], [place] (336) 299-7000   °Veita Bland [address], [place]  ° (336) 373-1557 Only accepts [place] Access Medicaid patients after they have their name applied to their card.  ° °Self-Pay (no insurance) in [place]: ° °Organization         Address  Phone   Notes  °Sickle Cell Patients, [facility] [address], [place] (336) 832-1970   °[facility] [address], [place] (336) 832-4400   °[facility] ° [address], [place] (336) 992-4800   °[facility]/Dr. Osei-Bonsu ° [address] [place] [address], [place] or [address], [place] (336) [phone] Phone number for both [place] and [place] locations is the same.  °[facility] [address], [place] (336) 299-0000   °[facility] [address] [place] [address], [place] or [address] (336) 852-7530 °(336) 878-2260   °[facility] [address], [place] (336) 350-1642, phone; (336) 294-5005, fax Sees patients 1st and 3rd Saturday of every month.  Must not qualify for public or private insurance (i.e. Medicaid, Medicare, [place] Health Choice, Veterans' Benefits) • Household income should be no more than 200% of the poverty level •The clinic cannot treat you if you are pregnant or think you are pregnant • Sexually transmitted diseases are not treated at the clinic.  ° ° °Dental Care: °Organization         Address  Phone  Notes  °[place] [facility] [address], [place] (336) 641-6152 Accepts children up to age 21 who are enrolled in Medicaid or [place] Health Choice; pregnant women with a Medicaid card; and children who have applied for Medicaid or [place] Health Choice, but were declined, whose parents can pay a reduced fee at time of service.  °[place]  [facility] [place]  [address], [place] (336) 641-7733 Accepts children up to age 21 who are enrolled in Medicaid or [place] Health Choice; pregnant women with a Medicaid card; and children who have applied for Medicaid or [place] Health Choice, but were declined, whose parents can pay a reduced fee at time of service.  °[facility] ° [address], [place] (336) 641-4533 Patients are seen by appointment only. Walk-ins are not accepted. [facility] will see patients 18 years of age and older. °Monday - Tuesday (8am-5pm) °Most Wednesdays (8:30-5pm) °$30 per visit, cash only  °[facility] ° [address], [place] (336) 641-4533 Patients are seen by appointment only. Walk-ins are not accepted. [facility] will see patients 18 years of age and older. °One   Wednesday Evening (Monthly: Volunteer Based).  $30 per visit, cash only  °[facility]  (919) [phone] for adults; Children under age 4, call Graduate Pediatric Dentistry at ([phone]) [phone]. Children aged 4-14, please call ([phone]) [phone] to request a pediatric application. ° Dental services are provided in all areas of dental care including fillings, crowns and bridges, complete and partial dentures, implants, gum treatment, root canals, and extractions. Preventive care is also provided. Treatment is provided to both adults and children. °Patients are selected via a lottery and there is often a waiting list. °  °[facility] [address], °[place] ° (336) 763-8833 [email or web address] °  °[facility] [address], [place], [place] (336)723-1848, Ext. 123 Second and Fourth Thursday of each month, opens at 6:30 AM; Clinic ends at 9 AM.  Patients are seen on a first-come first-served basis, and a limited number are seen during each clinic.  ° °[facility] ° [address], [place], [place] (336) 723-7904    Eligibility Requirements °You must have lived in [place], [place], or [place] counties for at least the last three months. °  You cannot be eligible for state or federal sponsored healthcare insurance, including Veterans Administration, Medicaid, or Medicare. °  You generally cannot be eligible for healthcare insurance through your employer.  °  How to apply: °Eligibility screenings are held every Tuesday and Wednesday afternoon from 1:00 pm until 4:00 pm. You do not need an appointment for the interview!  °[facility] [address], [place], [place] 336-631-2330   °[place] [facility]  336-342-8273   °[place] [facility]  336-703-3100   °[facility]  336-570-6415   ° °Behavioral Health Resources in the Community: °Intensive Outpatient Programs °Organization         Address  Phone  Notes  °[facility] [address], [place], [place] 336-878-6098   °[facility] [address], [place], [place] 336-832-9800   °[facility] [address], [place], [place] ° 336-882-2125   °[place] [address],  °[place], [place] 1-800-853-5163 or 336-641-4981   °Substance Abuse Resources °Organization         Address  Phone  Notes  °[facility]  336-882-2125   °[facility]  336-784-9470   °[facility]  336-285-9073   °[facility]  336-845-3988   °Residential & Outpatient Substance Abuse Program  1-800-659-3381   °Psychological Services °Organization         Address  Phone  Notes  °[facility]  336- 832-9600   °[facility]  336- 378-7881   °[facility] [address], [place] 1-800-853-5163 or 336-641-4981   ° °Mobile Crisis Teams °Organization         Address  Phone  Notes  °[facility], Mobile Crisis Care Unit  1-877-626-1772   °Assertive °[facility] Services ° [address]  [place], [place] 336-834-9664   °Sharon DeEsch [address] °[place] 336-554-5454   ° °Self-Help/Support Groups °Organization         Address  Phone             Notes  °[facility] [place] - variety of support groups  336- 373-1402 Call for more information  °Narcotics Anonymous (NA), [facility] [address], °[place]  2 meetings at this location  ° °  Residential Treatment Programs Organization         Address  Phone  Notes  [facility] [address]    [place]  1-(240)721-6443   [facility]  [address] [place] [address], [place], [place]   [place], [place] 845-641-1117 Admissions: 8am-3pm M-F  Incentives Substance [place] [address],    [place], [place] [phone]   [facility] [address] [place], [place], [place]   [facility] [address],  [place], [place]   [facility] - Intensive Outpatient [address], [place], [place]   [facility] ([place].) [place].,  [place], [place] 1-470-115-4754 or (215)[phone]   [facility] ([facility]) [address] [place], [place] Accepts Medicaid  [facility] [place] [address],  [place] 1-(986)768-8005 Substance Abuse/Addiction Treatment   [facility] Organization         Address  Phone  Notes  [facility]  831-559-1481   Domenic Schwab, PhD [address] Arlis Porta [place], [place]   440-013-5755 or (970) [phone]   [place] [place], [place] (787)[phone]   [facility] [address], [place], [place] (623)843-2690 Insurance/Medicaid/sponsorship through [facility] and Families [address]                                    [place], [place] [phone] [place] [address], [place] [phone]    Dr. Adele Schilder  [phone]   [facility] [place] Dept. 1) [address], [place] 2) [place] 3)  [place] [address], [place] [phone]  [phone]   [place] [phone] or ([phone] (After Hours)       Emergency Department Resource Guide 1) Find a Doctor and Pay Out of Pocket Although you won't have to find out who is covered by your insurance plan, it is a good idea to ask around and get recommendations. You will then need to call the office and see if the doctor you have chosen will accept you as a new patient and what types of options they offer for patients who are self-pay. Some doctors offer discounts or will set up payment plans for their patients who do not have insurance, but you will need to ask so you aren't surprised when you get to your appointment.  2) Contact Your Local Health Department Not all health departments have doctors that can see patients for sick visits, but many do, so it is worth a call to see if yours does. If you don't know where your local health department is, you can check in your phone book. The CDC also has a tool to help you locate your state's health department, and many state websites also have listings of all of their local health departments.  3) Find a [place] If your illness is not likely to be very severe or complicated, you may want to try a walk in clinic. These are popping up all over the country in pharmacies, drugstores, and shopping centers. They're usually staffed by  nurse practitioners or physician assistants that have been trained to treat common illnesses and complaints. They're usually fairly quick and inexpensive. However, if you have serious medical issues or chronic medical problems, these are probably not your best option.  No Primary  Care Doctor: - Call Health Connect at  (972) [phone] - they can help you locate a primary care doctor that  accepts your insurance, provides certain services, etc. - Physician Referral Service- (815) [phone]  Chronic Pain Problems: Organization         Address  Phone   Notes  [facility]  [phone] Patients need to be referred by their primary care doctor.   Medication Assistance: Organization         Address  Phone   Notes  [facility] [place]., [place], [place] [address] [phone] --Must be a resident of [facility] -- Must have NO insurance coverage whatsoever (no Medicaid/ Medicare, etc.) -- The pt. MUST have a primary care doctor that directs their care regularly and follows them in the community   [facility]ist  [phone]   [employer]  [phone]    Agencies that provide inexpensive medical care: Organization         Address  Phone   Notes  [place]  (902) [phone]   [facility]    [phone]   [facility] [place], [place] [address] [phone]   [place] [address], [place] [phone]   [facility]    (336)627-8227   [facility]    7247274527   [place] and [place] [place] [address], [place] Phone:  [phone], Fax:  [phone] Hours of Operation:  9 am - 6 pm, M-F.  Also accepts Medicaid/Medicare and self-pay.  [facility] for [address], [address], [place] Phone: [phone], Fax: [phone]. Hours of Operation:  8:30 am - 5:30 pm, M-F.  Also accepts Medicaid and self-pay.  [facility] [place] [address] [place] Phone: [phone]   [place], [place], [place] (203) [phone], Ext. 123 Mondays & Thursdays: 7-9 AM.  First 15 patients are seen on a first  come, first serve basis.    [place] Providers:  Organization         Address  Phone   Notes  [facility] [address], [place] [phone] Also accepts self-pay patients.  [facility] [address], [place]  [phone]   [place], [address], [place] [phone]   [facility] [address], [place] [phone]   Lucianne Lei [address], [place]   [phone] Only accepts [place] Access [place] patients after they have their name applied to their card.   Self-Pay (no insurance) in [facility]:  Organization         Address  Phone   Notes  Sickle Cell Patients, [facility] [place] [phone]   [facility] [place] [phone]   [facility]gent [place]  [address], [place] [phone]   [facility]/Dr. Vista Lawman  [address] [place] or [place], [address], [place] [phone] Phone number for both [place] and [place] locations is the same.  [facility] [address], [place] [phone]   [facility] [address] [place] or [address] [phone]   [facility] [address], [place] ([phone], phone; [phone], fax Sees patients 1st and 3rd Saturday of every month.  Must not qualify for public or private insurance (i.e. Medicaid, Medicare, [place] Health Choice, Veterans' Benefits)  Household income should be no more than 200% of the poverty level The clinic cannot treat you if you are pregnant or think you are pregnant  Sexually transmitted diseases are not treated at the clinic.    Dental Care: Organization          Address  Phone  Notes  [facility] Department of [facility] [place] ([phone])[phone] Accepts children up to age 1 who are enrolled in [place] or [place]; pregnant women with a Medicaid card; and children who have applied for Medicaid or [place] Health Choice, but were declined, whose parents can pay a reduced fee at time of service.  [facility]  [address], [place] [phone] Accepts children up to age 46 who are enrolled in [place] or [place]; pregnant women with a Medicaid card; and children who have applied for Medicaid or  Health Choice, but were declined, whose parents can pay a reduced fee at time of service.  [facility]  [place] [phone] Patients are seen by appointment only. Walk-ins are not accepted. [place] will see patients 44 years of age and older. Monday - Tuesday (8am-5pm) Most Wednesdays (8:30-5pm) $30 per visit, cash only  [facility]  [address], [facility] [phone] Patients are seen by appointment only. Walk-ins are not accepted. [place] will see patients 23 years of age and older. One Wednesday Evening (Monthly: Volunteer Based).  $30 per visit, cash only  [place]  ([phone])[phone] for adults; Children under age 55, call Graduate Pediatric Dentistry at [phone]. Children aged 48-14, please call [phone] to request a pediatric application.  Dental services are provided in all areas of dental care including fillings, crowns and bridges, complete and partial dentures, implants, gum treatment, root canals, and extractions. Preventive care is also provided. Treatment is provided to both adults and children. Patients are selected via a lottery and there is often a waiting list.   [facility] [address], [place]  ([phone] [email or web address]   [facility] [address] [place], [place] [phone], Ext. 123 Second and Fourth Thursday of each month, opens at 6:30 AM; Clinic ends at 9 AM.  Patients are seen on a first-come first-served basis, and a limited number are seen during each clinic.   [facility]  [address] Hillard Danker [place], [place] [phone]   Eligibility Requirements You must have lived in [place], [place], or [place] counties for at least the last three months.   You cannot be eligible for state or federal sponsored [employer], including [employer], [place], or [employer].   You generally cannot be eligible for healthcare insurance through your employer.  How to apply: Eligibility screenings are held every Tuesday and Wednesday afternoon from 1:00 pm until 4:00 pm. You do not need an appointment for the interview!  [facility] [address], [place], [place]   [place]  [place] Department  [place]  [phone]    Behavioral Health Resources in the Community: Intensive Outpatient Programs Organization         Address  Phone  Notes  [place]. [address], [place], [place] [phone]   [facility] [address] [place], [place]   [facility]: [facility] [address] [place], [place]   [place] [address],  [place], [place] or [phone]   Substance Abuse Resources Organization         Address  Phone  Notes  [facility]  540-284-1764   [place]  (269)[phone]   [facility] [place] [facility]   Chinita Pester  763-318-8855   Residential & Outpatient Substance Abuse Program  (262) [phone]   Psychological  Services Organization         Address  Phone  Notes  [facility] [place]  [address]  [phone]   [place] [address], [place] or [phone]    Mobile Crisis Teams Organization         Address  Phone  Notes  [facility], Mobile Crisis Care Unit  515-515-1640   [facility]  [address] [place], [place]   [place] [address] [place] [phone]    Self-Help/Support Groups Organization         Address  Phone             Notes  [place]. of [place] - variety of support groups  [place] Call for more information  Narcotics Anonymous (NA), [facility] [address], [employer]   2 meetings at this location   Special educational needs teacher         Address  Phone  Notes  [facility] Residential Treatment [place],    [place]  [phone]   [facility]  [address] [place] [address], [place], [place]   [place], [place] [phone] Admissions: 8am-3pm M-F  Incentives Substance [place] [address],    [place], [place] [phone]   [facility] [address] [place], [place], [place]   [facility] [address]  [place], [place]   [facility] - Intensive Outpatient [address], [place], [place]   [facility] ([place].) [place].,  [place], [place] 1-801-631-7308 or [phone]   [facility] ([facility]S) [address] [place], [place] Accepts Medicaid  [facility] [place] [address]  [place] 1-778-025-7326 Substance Abuse/Addiction Treatment   [facility] Organization         Address  Phone  Notes  [facility]  [phone]   Domenic Schwab, PhD [address] Arlis Porta [place], [place]   [phone] or [phone]   [place], [place] [phone]   [facility] [address], [place], [place] [phone] Insurance/Medicaid/sponsorship through [employer] and Families [address] [place], [place] [phone] [place] [address]   [place], [place] [phone]    Dr. Adele Schilder  [phone]   [facility] [place] [facility] Dept. 1) [address]  2) [place] 3)  [place] [address], [place] [phone]  [phone]   [place] [phone] or [phone] (After Hours)       Finding Treatment for Alcohol and Drug Addiction It can be hard to find the right place to get professional treatment. Here are some important things to consider:  There are different types of treatment to choose from.  Some programs are live-in (residential) while others are not (outpatient). Sometimes a combination is offered.  No single type of program is right for everyone.  Most treatment programs involve a combination of education, counseling, and a 12-step, spiritually-based approach.  There are non-spiritually based programs (not 12-step).  Some treatment programs are government sponsored. They are geared for patients without private insurance.  Treatment programs can vary in many respects such as:  Cost and types of insurance accepted.  Types of on-site medical services offered.  Length of stay, setting, and size.  Overall philosophy of treatment. A person may need specialized treatment or have needs not addressed by all programs. For example, adolescents need treatment appropriate for their age. Other people have secondary disorders that must be managed as well.  Secondary conditions can include mental illness, such as depression or diabetes. Often, a period of detoxification from alcohol or drugs is needed. This requires medical supervision and not all programs offer this. THINGS TO CONSIDER WHEN SELECTING A TREATMENT PROGRAM   Is the program certified by the appropriate government agency? Even private programs must be certified and employ certified professionals.  Does the program accept your insurance? If not, can a payment plan be set up?  Is the facility clean, organized, and well run? Do they allow you to speak with graduates who can share their treatment experience with you? Can you tour the facility? Can you meet with staff?  Does the program meet the full range of individual needs?  Does the treatment program address sexual orientation and physical disabilities? Do they provide age, gender, and culturally appropriate treatment services?  Is treatment available in languages other than English?  Is long-term aftercare support or guidance encouraged and provided?  Is assessment of an individual's treatment plan ongoing to ensure it meets changing needs?  Does the program use strategies to encourage reluctant patients to remain in treatment long enough to increase the likelihood of success?  Does the program offer counseling (individual or group) and other behavioral therapies?  Does the program offer medicine as part of the treatment regimen, if needed?  Is there ongoing monitoring of possible relapse? Is there a defined relapse prevention program? Are services or referrals offered to family members to ensure they understand addiction and the recovery process? This would help them support the recovering individual.  Are 12-step meetings held at the center or is transport available for patients to attend outside meetings? In countries outside of the [place]  and [place], see local directories for contact information for services in your  area. Document Released: 12/17/2004 Document Revised: 04/12/2011 Document Reviewed: 06/29/2007 [facility] Patient Information 2015 [place], [place]. This information is not intended to replace advice given to you by your health care provider. Make sure you discuss any questions you have with your health care provider.
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
## 2013-08-13 NOTE — B[facility] Counselor (Signed)
Writer left voicemails for [facility] at [phone] and [phone] to follow up on referral faxed.  Arnold Long, [place] Assessment Counselor

## 2013-09-03 ENCOUNTER — Emergency Department (HOSPITAL_COMMUNITY)
Admission: EM | Admit: 2013-09-03 | Discharge: 2013-09-03 | Disposition: A | Discharge status: Home / Self Care | Payer: Medicaid Other | Attending: Emergency Medicine | Admitting: Emergency Medicine

## 2013-09-03 ENCOUNTER — Encounter (HOSPITAL_COMMUNITY): Payer: Self-pay | Admitting: Emergency Medicine

## 2013-09-03 DIAGNOSIS — F319 Bipolar disorder, unspecified: Secondary | ICD-10-CM | POA: Insufficient documentation

## 2013-09-03 DIAGNOSIS — Z8673 Personal history of transient ischemic attack (TIA), and cerebral infarction without residual deficits: Secondary | ICD-10-CM | POA: Insufficient documentation

## 2013-09-03 DIAGNOSIS — F1010 Alcohol abuse, uncomplicated: Secondary | ICD-10-CM | POA: Diagnosis not present

## 2013-09-03 DIAGNOSIS — IMO0002 Reserved for concepts with insufficient information to code with codable children: Secondary | ICD-10-CM | POA: Insufficient documentation

## 2013-09-03 DIAGNOSIS — Z8719 Personal history of other diseases of the digestive system: Secondary | ICD-10-CM | POA: Diagnosis not present

## 2013-09-03 DIAGNOSIS — Z8739 Personal history of other diseases of the musculoskeletal system and connective tissue: Secondary | ICD-10-CM | POA: Insufficient documentation

## 2013-09-03 DIAGNOSIS — Z046 Encounter for general psychiatric examination, requested by authority: Secondary | ICD-10-CM | POA: Insufficient documentation

## 2013-09-03 DIAGNOSIS — F17200 Nicotine dependence, unspecified, uncomplicated: Secondary | ICD-10-CM | POA: Diagnosis not present

## 2013-09-03 DIAGNOSIS — F1310 Sedative, hypnotic or anxiolytic abuse, uncomplicated: Secondary | ICD-10-CM | POA: Insufficient documentation

## 2013-09-03 DIAGNOSIS — Z79899 Other long term (current) drug therapy: Secondary | ICD-10-CM | POA: Diagnosis not present

## 2013-09-03 DIAGNOSIS — F411 Generalized anxiety disorder: Secondary | ICD-10-CM | POA: Diagnosis not present

## 2013-09-03 DIAGNOSIS — J449 Chronic obstructive pulmonary disease, unspecified: Secondary | ICD-10-CM | POA: Insufficient documentation

## 2013-09-03 DIAGNOSIS — J4489 Other specified chronic obstructive pulmonary disease: Secondary | ICD-10-CM | POA: Insufficient documentation

## 2013-09-03 LAB — COMPREHENSIVE METABOLIC PANEL
ALT: 16 U/L (ref 0–35)
AST: 40 U/L — AB (ref 0–37)
Albumin: 3.8 g/dL (ref 3.5–5.2)
Alkaline Phosphatase: 89 U/L (ref 39–117)
Anion gap: 15 (ref 5–15)
BUN: 9 mg/dL (ref 6–23)
CO2: 24 meq/L (ref 19–32)
Calcium: 9.2 mg/dL (ref 8.4–10.5)
Chloride: 102 mEq/L (ref 96–112)
Creatinine, Ser: 0.7 mg/dL (ref 0.50–1.10)
GFR calc Af Amer: >90 mL/min (ref >90)
Glucose, Bld: 107 mg/dL — ABNORMAL HIGH (ref 70–99)
POTASSIUM: 4.1 meq/L (ref 3.7–5.3)
SODIUM: 141 meq/L (ref 137–147)
Total Bilirubin: 0.2 mg/dL — ABNORMAL LOW (ref 0.3–1.2)
Total Protein: 8.3 g/dL (ref 6.0–8.3)

## 2013-09-03 LAB — RAPID URINE DRUG SCREEN, HOSP PERFORMED
AMPHETAMINES: NOT DETECTED
BARBITURATES: POSITIVE — AB
Benzodiazepines: NOT DETECTED
Cocaine: NOT DETECTED
Opiates: NOT DETECTED
Tetrahydrocannabinol: NOT DETECTED

## 2013-09-03 LAB — ETHANOL: Alcohol, Ethyl (B): <11 mg/dL (ref 0–11)

## 2013-09-03 LAB — ACETAMINOPHEN LEVEL

## 2013-09-03 LAB — CBC
HCT: 36.1 % (ref 36.0–46.0)
Hemoglobin: 12.1 g/dL (ref 12.0–15.0)
MCH: 32.7 pg (ref 26.0–34.0)
MCHC: 33.5 g/dL (ref 30.0–36.0)
MCV: 97.6 fL (ref 78.0–100.0)
Platelets: 301 10*3/uL (ref 150–400)
RBC: 3.7 MIL/uL — AB (ref 3.87–5.11)
RDW: 16.1 % — ABNORMAL HIGH (ref 11.5–15.5)
WBC: 7.2 10*3/uL (ref 4.0–10.5)

## 2013-09-03 LAB — SALICYLATE LEVEL: Salicylate Lvl: <2 mg/dL — ABNORMAL LOW (ref 2.8–20.0)

## 2013-09-03 MED ORDER — ONDANSETRON HCL 4 MG PO TABS: 4.0000 mg | ORAL_TABLET | Freq: Three times a day (TID) | ORAL | Status: DC | PRN

## 2013-09-03 MED ORDER — NICOTINE 21 MG/24HR TD PT24: 21.0000 mg | MEDICATED_PATCH | Freq: Every day | TRANSDERMAL | Status: DC

## 2013-09-03 MED ORDER — ACETAMINOPHEN 325 MG PO TABS
650.0000 mg | ORAL_TABLET | ORAL | Status: DC | PRN
Start: 2013-09-03 — End: 2013-09-04

## 2013-09-03 MED ORDER — LORAZEPAM 1 MG PO TABS: 0.0000 mg | ORAL_TABLET | Freq: Two times a day (BID) | ORAL | Status: DC

## 2013-09-03 MED ORDER — IBUPROFEN 200 MG PO TABS: 600.0000 mg | ORAL_TABLET | Freq: Three times a day (TID) | ORAL | Status: DC | PRN

## 2013-09-03 MED ORDER — LORAZEPAM 1 MG PO TABS: 0.0000 mg | ORAL_TABLET | Freq: Four times a day (QID) | ORAL | Status: DC

## 2013-09-03 MED ORDER — TIOTROPIUM BROMIDE MONOHYDRATE 18 MCG IN CAPS
18.0000 ug | ORAL_CAPSULE | Freq: Every day | RESPIRATORY_TRACT | Status: DC
  Filled 2013-09-03: qty 5

## 2013-09-03 MED ORDER — ALBUTEROL SULFATE HFA 108 (90 BASE) MCG/ACT IN AERS: 2.0000 | INHALATION_SPRAY | Freq: Four times a day (QID) | RESPIRATORY_TRACT | Status: DC | PRN

## 2013-09-03 MED ORDER — PRIMIDONE 50 MG PO TABS
100.0000 mg | ORAL_TABLET | Freq: Every day | ORAL | Status: DC
  Filled 2013-09-03: qty 2

## 2013-09-03 MED ORDER — ALUM & MAG HYDROXIDE-SIMETH 200-200-20 MG/5ML PO SUSP: 30.0000 mL | ORAL | Status: DC | PRN

## 2013-09-03 MED ORDER — TOPIRAMATE 25 MG PO TABS: 50.0000 mg | ORAL_TABLET | ORAL | Status: DC

## 2013-09-03 MED ORDER — LORAZEPAM 1 MG PO TABS: 1.0000 mg | ORAL_TABLET | Freq: Three times a day (TID) | ORAL | Status: DC | PRN

## 2013-09-03 MED ORDER — PAROXETINE HCL 30 MG PO TABS
60.0000 mg | ORAL_TABLET | Freq: Every morning | ORAL | Status: DC
  Filled 2013-09-03: qty 2

## 2013-09-03 MED ORDER — LORAZEPAM 2 MG/ML IJ SOLN: 0.0000 mg | Freq: Two times a day (BID) | INTRAMUSCULAR | Status: DC

## 2013-09-03 MED ORDER — LORAZEPAM 2 MG/ML IJ SOLN: 0.0000 mg | Freq: Four times a day (QID) | INTRAMUSCULAR | Status: DC

## 2013-09-03 MED ORDER — ZOLPIDEM TARTRATE 5 MG PO TABS: 5.0000 mg | ORAL_TABLET | Freq: Every evening | ORAL | Status: DC | PRN

## 2013-09-03 NOTE — ED Provider Notes (Signed)
Assuming care of patient  Patient in the ED for alcohol abuse and SI. IVC done by son for SI.                Patient is clinically sober. She is talking coherently, gait is normal, and is demonstrating rational thought process.  She denies SI. Reports that she told son that she wanted to go to bed, sleep and left alone. She has never attempted Suicide, and has no active plans. She has no new stressors in life, outside of the ordinary.  Spoke with son, who stated that patient has mentioned several times that she wants to die. She has never attempted suicide, nor has she ever mentioned a plan.  Patient states that son is making up this allegations. She might have mentioned things of this nature when she is intoxicated, but she really has no intentions of killing herself.  Son reports at least 3-4 admits to psych facility for [facility] - pt denies, no SI visit in our system.  Spoke with Mr. Timmothy Sours, patient's current boy friend. He stated that patient's son called him an stated that he was going to get ivc paperwork for his mother for DETOX. He was not informed of SI. There is no hx of SI per Mr. Timmothy Sours. Pt loves him, her family, and has never tried to kill herself.   I believe the patient. She is very sincere and consistent during my interview. I will discharge her. IVP paperwork rescinded. Boyfriend happy to come pick her up.  Varney Biles, MD 09/03/13 2105

## 2013-09-03 NOTE — ED Notes (Signed)
Patient has one bag of belongings in locker 29.
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
## 2013-09-03 NOTE — Discharge Instructions (Signed)
We saw you in the ER for your mental health concerns and had our behavioral health team evaluate you. The team feels comfortable sending you home, please follow the recommendations given to you by them and the follow-up and medications they have prescribed to you. Please refrain from substance abuse. Return to the ER if your symptoms worsen.   Alcohol Use Disorder Alcohol use disorder is a mental disorder. It is not a one-time incident of heavy drinking. Alcohol use disorder is the excessive and uncontrollable use of alcohol over time that leads to problems with functioning in one or more areas of daily living. People with this disorder risk harming themselves and others when they drink to excess. Alcohol use disorder also can cause other mental disorders, such as mood and anxiety disorders, and serious physical problems. People with alcohol use disorder often misuse other drugs.  Alcohol use disorder is common and widespread. Some people with this disorder drink alcohol to cope with or escape from negative life events. Others drink to relieve chronic pain or symptoms of mental illness. People with a family history of alcohol use disorder are at higher risk of losing control and using alcohol to excess.  SYMPTOMS  Signs and symptoms of alcohol use disorder may include the following:   Consumption ofalcohol inlarger amounts or over a longer period of time than intended.  Multiple unsuccessful attempts to cutdown or control alcohol use.   A great deal of time spent obtaining alcohol, using alcohol, or recovering from the effects of alcohol (hangover).  A strong desire or urge to use alcohol (cravings).   Continued use of alcohol despite problems at work, school, or home because of alcohol use.   Continued use of alcohol despite problems in relationships because of alcohol use.  Continued use of alcohol in situations when it is physically hazardous, such as driving a car.  Continued use  of alcohol despite awareness of a physical or psychological problem that is likely related to alcohol use. Physical problems related to alcohol use can involve the brain, heart, liver, stomach, and intestines. Psychological problems related to alcohol use include intoxication, depression, anxiety, psychosis, delirium, and dementia.   The need for increased amounts of alcohol to achieve the same desired effect, or a decreased effect from the consumption of the same amount of alcohol (tolerance).  Withdrawal symptoms upon reducing or stopping alcohol use, or alcohol use to reduce or avoid withdrawal symptoms. Withdrawal symptoms include:  Racing heart.  Hand tremor.  Difficulty sleeping.  Nausea.  Vomiting.  Hallucinations.  Restlessness.  Seizures. DIAGNOSIS Alcohol use disorder is diagnosed through an assessment by your health care provider. Your health care provider may start by asking three or four questions to screen for excessive or problematic alcohol use. To confirm a diagnosis of alcohol use disorder, at least two symptoms must be present within a 21-month period. The severity of alcohol use disorder depends on the number of symptoms:  Mild--two or three.  Moderate--four or five.  Severe--six or more. Your health care provider may perform a physical exam or use results from lab tests to see if you have physical problems resulting from alcohol use. Your health care provider may refer you to a mental health professional for evaluation. TREATMENT  Some people with alcohol use disorder are able to reduce their alcohol use to low-risk levels. Some people with alcohol use disorder need to quit drinking alcohol. When necessary, mental health professionals with specialized training in substance use treatment can help.  Your health care provider can help you decide how severe your alcohol use disorder is and what type of treatment you need. The following forms of treatment are available:    Detoxification. Detoxification involves the use of prescription medicines to prevent alcohol withdrawal symptoms in the first week after quitting. This is important for people with a history of symptoms of withdrawal and for heavy drinkers who are likely to have withdrawal symptoms. Alcohol withdrawal can be dangerous and, in severe cases, cause death. Detoxification is usually provided in a hospital or in-patient substance use treatment facility.  Counseling or talk therapy. Talk therapy is provided by substance use treatment counselors. It addresses the reasons people use alcohol and ways to keep them from drinking again. The goals of talk therapy are to help people with alcohol use disorder find healthy activities and ways to cope with life stress, to identify and avoid triggers for alcohol use, and to handle cravings, which can cause relapse.  Medicines.Different medicines can help treat alcohol use disorder through the following actions:  Decrease alcohol cravings.  Decrease the positive reward response felt from alcohol use.  Produce an uncomfortable physical reaction when alcohol is used (aversion therapy).  Support groups. Support groups are run by people who have quit drinking. They provide emotional support, advice, and guidance. These forms of treatment are often combined. Some people with alcohol use disorder benefit from intensive combination treatment provided by specialized substance use treatment centers. Both inpatient and outpatient treatment programs are available. Document Released: 02/26/2004 Document Revised: 06/04/2013 Document Reviewed: 04/27/2012 [facility] Patient Information 2015 [place], [place]. This information is not intended to replace advice given to you by your health care provider. Make sure you discuss any questions you have with your health care provider.
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
## 2013-09-03 NOTE — ED Notes (Signed)
Pt's son called to speak with pt.  Pt was given the phone.

## 2013-09-03 NOTE — ED Provider Notes (Signed)
CSN: [id number]     Arrival date & time 09/03/13  0953 History   First MD Initiated Contact with Patient 09/03/13 1002     Chief Complaint  Patient presents with  . IVC   . Medical Clearance     (Consider location/radiation/quality/duration/timing/severity/associated sxs/prior Treatment) HPI Comments: IVC'd by son for suicidal ideation. Heavy drinker. IVC reports patient made stabbing motions towards him.  Patient here denies all allegations from the IVC.  Patient is a 58 y.o. female presenting with mental health disorder. The history is provided by the patient.  Mental Health Problem Presenting symptoms: bizarre behavior   Patient accompanied by:  Law enforcement Degree of incapacity (severity):  Moderate Onset quality:  Gradual Timing:  Constant Progression:  Unchanged Chronicity:  New Associated symptoms: no abdominal pain     Past Medical History  Diagnosis Date  . Substance abuse   . Bipolar 1 disorder   . Depression   . Fibromyalgia   . COPD (chronic obstructive pulmonary disease)   . Arthritis   . Anxiety   . GERD (gastroesophageal reflux disease)   . Stroke   . Hiatal hernia   . ETOH abuse    Past Surgical History  Procedure Laterality Date  . Laser gum sx    . Appendectomy    . Tubal ligation    . Back sx    . Cervical fusion    . Tubaligation    . Ovary surgery     Family History  Problem Relation Age of Onset  . Heart disease Mother   . Diabetes Father   . Heart disease Father    History  Substance Use Topics  . Smoking status: Current Every Day Smoker -- 1.00 packs/day    Types: Cigarettes  . Smokeless tobacco: Not on file  . Alcohol Use: 0.0 oz/week    1-2 drink(s) per week     Comment: 5th of vodka daily   OB History   Grav Para Term Preterm Abortions TAB SAB Ect Mult Living                 Review of Systems  Constitutional: Negative for fever.  Respiratory: Negative for cough and shortness of breath.   Gastrointestinal: Negative  for vomiting and abdominal pain.  All other systems reviewed and are negative.     Allergies  Review of patient's allergies indicates no known allergies.  Home Medications   Prior to Admission medications   Medication Sig Start Date End Date Taking? Authorizing Provider  albuterol (PROVENTIL HFA;VENTOLIN HFA) 108 (90 BASE) MCG/ACT inhaler Inhale 2 puffs into the lungs every 6 (six) hours as needed for wheezing or shortness of breath.   Yes Historical Provider, MD  estrogens, conjugated, (PREMARIN) 1.25 MG tablet Take 1.25 mg by mouth daily. Days 1 through 25   Yes Historical Provider, MD  medroxyPROGESTERone (PROVERA) 10 MG tablet Take 10 mg by mouth daily. Days 16 through 25   Yes Historical Provider, MD  Multiple Vitamin (MULTIVITAMIN WITH MINERALS) TABS tablet Take 1 tablet by mouth daily.   Yes Historical Provider, MD  PARoxetine (PAXIL) 30 MG tablet Take 60 mg by mouth every morning.    Yes Historical Provider, MD  primidone (MYSOLINE) 50 MG tablet Take 100 mg by mouth at bedtime.    Yes Historical Provider, MD  sulfamethoxazole-trimethoprim (BACTRIM DS) 800-160 MG per tablet Take 1 tablet by mouth 2 (two) times daily.   Yes Historical Provider, MD  tiotropium (SPIRIVA) 18 MCG inhalation  capsule Place 18 mcg into inhaler and inhale daily as needed.   Yes Historical Provider, MD  topiramate (TOPAMAX) 50 MG tablet Take 50 mg by mouth See admin instructions. Take 1/2 tab every 12 hours then titrate to a maximum of 200 mg every 12 hours   Yes Historical Provider, MD  triamcinolone cream (KENALOG) 0.1 % Apply 1 application topically daily. Apply to arms.   Yes Historical Provider, MD   BP 189/109  Pulse 119  Temp(Src) 98.5 F (36.9 C) (Oral)  Resp 20  SpO2 97%  LMP 02/26/2011 Physical Exam  Nursing note and vitals reviewed. Constitutional: She is oriented to person, place, and time. She appears well-developed and well-nourished. No distress.  HENT:  Head: Normocephalic and  atraumatic.  Mouth/Throat: Oropharynx is clear and moist.  Eyes: EOM are normal. Pupils are equal, round, and reactive to light.  Neck: Normal range of motion. Neck supple.  Cardiovascular: Normal rate and regular rhythm.  Exam reveals no friction rub.   No murmur heard. Pulmonary/Chest: Effort normal and breath sounds normal. No respiratory distress. She has no wheezes. She has no rales.  Abdominal: Soft. She exhibits no distension. There is no tenderness. There is no rebound.  Musculoskeletal: Normal range of motion. She exhibits no edema.  Neurological: She is alert and oriented to person, place, and time.  Skin: No rash noted. She is not diaphoretic.    ED Course  Procedures (including critical care time) Labs Review Labs Reviewed - No data to display  Imaging Review No results found.   EKG Interpretation None      MDM   Final diagnoses:  Alcohol abuse    58 year old female here by [facility]. IVC taken out by son because she's been aggressive towards him, making stabbing motions. IVC also taken out because she is a heavy drinker once or herself. He denies all these allegations. She does not have any SI or HI today. She states she is going to alcohol detox today. History of suicide attempts, roughly 35 years ago with overdose. She is calm, cooperative. Will have psych assess.    Osvaldo Shipper, MD 09/03/13 [phone]
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
## 2013-09-03 NOTE — ED Notes (Signed)
IVC paperwork states pt told don she wanted to go to sleep and never wake up. Stating she is constantly speaking of hurting herself. PT has ongoing problem with ETOH.

## 2013-09-03 NOTE — ED Notes (Signed)
Dr. Kathrynn Humble made aware of pt's BP.

## 2013-09-03 NOTE — ED Notes (Signed)
Pt disgruntled because she was told by "several people" that she would be going home today after she saw the TTS team. It was explained to her that she would be seen in order and that this could be a lengthy process. Offered a nicotine patch and she said "I really don't care!" Sitter remains at bedside.

## 2014-05-28 ENCOUNTER — Other Ambulatory Visit: Payer: Self-pay | Admitting: Family Medicine

## 2014-05-28 DIAGNOSIS — R921 Mammographic calcification found on diagnostic imaging of breast: Secondary | ICD-10-CM

## 2014-05-28 DIAGNOSIS — N63 Unspecified lump in unspecified breast: Secondary | ICD-10-CM

## 2014-05-28 DIAGNOSIS — Z803 Family history of malignant neoplasm of breast: Secondary | ICD-10-CM

## 2014-05-31 ENCOUNTER — Ambulatory Visit
Admission: RE | Admit: 2014-05-31 | Discharge: 2014-05-31 | Disposition: A | Discharge status: Home / Self Care | Payer: Medicaid Other | Source: Ambulatory Visit | Attending: Family Medicine | Admitting: Family Medicine

## 2014-05-31 ENCOUNTER — Other Ambulatory Visit: Payer: Self-pay | Admitting: Family Medicine

## 2014-05-31 ENCOUNTER — Encounter (INDEPENDENT_AMBULATORY_CARE_PROVIDER_SITE_OTHER): Payer: Self-pay

## 2014-05-31 DIAGNOSIS — N63 Unspecified lump in unspecified breast: Secondary | ICD-10-CM

## 2014-05-31 DIAGNOSIS — Z1231 Encounter for screening mammogram for malignant neoplasm of breast: Secondary | ICD-10-CM

## 2014-05-31 DIAGNOSIS — R921 Mammographic calcification found on diagnostic imaging of breast: Secondary | ICD-10-CM

## 2014-06-03 ENCOUNTER — Other Ambulatory Visit: Payer: Self-pay | Admitting: Family Medicine

## 2014-06-03 DIAGNOSIS — R928 Other abnormal and inconclusive findings on diagnostic imaging of breast: Secondary | ICD-10-CM

## 2014-06-11 ENCOUNTER — Other Ambulatory Visit: Payer: Self-pay | Admitting: Family Medicine

## 2014-06-11 ENCOUNTER — Ambulatory Visit
Admission: RE | Admit: 2014-06-11 | Discharge: 2014-06-11 | Disposition: A | Discharge status: Home / Self Care | Payer: Medicaid Other | Source: Ambulatory Visit | Attending: Family Medicine | Admitting: Family Medicine

## 2014-06-11 DIAGNOSIS — R928 Other abnormal and inconclusive findings on diagnostic imaging of breast: Secondary | ICD-10-CM

## 2015-04-04 ENCOUNTER — Encounter: Payer: Self-pay | Admitting: Internal Medicine

## 2015-05-20 ENCOUNTER — Encounter: Payer: Self-pay | Admitting: Internal Medicine

## 2015-06-13 ENCOUNTER — Ambulatory Visit: Payer: Medicaid Other | Admitting: Internal Medicine

## 2015-08-25 ENCOUNTER — Other Ambulatory Visit: Payer: Self-pay | Admitting: Family Medicine

## 2015-08-25 DIAGNOSIS — Z1231 Encounter for screening mammogram for malignant neoplasm of breast: Secondary | ICD-10-CM

## 2015-09-01 ENCOUNTER — Ambulatory Visit
Admission: RE | Admit: 2015-09-01 | Discharge: 2015-09-01 | Disposition: A | Discharge status: Home / Self Care | Payer: Medicaid Other | Source: Ambulatory Visit | Attending: Family Medicine | Admitting: Family Medicine

## 2015-09-01 DIAGNOSIS — Z1231 Encounter for screening mammogram for malignant neoplasm of breast: Secondary | ICD-10-CM

## 2015-09-16 ENCOUNTER — Other Ambulatory Visit: Payer: Self-pay | Admitting: Family Medicine

## 2015-09-16 DIAGNOSIS — N644 Mastodynia: Secondary | ICD-10-CM

## 2015-09-22 ENCOUNTER — Ambulatory Visit
Admission: RE | Admit: 2015-09-22 | Discharge: 2015-09-22 | Disposition: A | Discharge status: Home / Self Care | Payer: Medicaid Other | Source: Ambulatory Visit | Attending: Family Medicine | Admitting: Family Medicine

## 2015-09-22 DIAGNOSIS — N644 Mastodynia: Secondary | ICD-10-CM

## 2015-09-30 ENCOUNTER — Encounter (INDEPENDENT_AMBULATORY_CARE_PROVIDER_SITE_OTHER): Payer: Self-pay

## 2015-09-30 ENCOUNTER — Ambulatory Visit (INDEPENDENT_AMBULATORY_CARE_PROVIDER_SITE_OTHER): Payer: Medicaid Other | Admitting: Internal Medicine

## 2015-09-30 ENCOUNTER — Encounter: Payer: Self-pay | Admitting: Internal Medicine

## 2015-09-30 VITALS — BP 136/88 | HR 80 | Ht 62.0 in | Wt 121.0 lb

## 2015-09-30 DIAGNOSIS — R079 Chest pain, unspecified: Secondary | ICD-10-CM | POA: Diagnosis not present

## 2015-09-30 DIAGNOSIS — Z720 Tobacco use: Secondary | ICD-10-CM | POA: Diagnosis not present

## 2015-09-30 DIAGNOSIS — E785 Hyperlipidemia, unspecified: Secondary | ICD-10-CM

## 2015-09-30 DIAGNOSIS — I739 Peripheral vascular disease, unspecified: Secondary | ICD-10-CM

## 2015-09-30 DIAGNOSIS — R0989 Other specified symptoms and signs involving the circulatory and respiratory systems: Secondary | ICD-10-CM

## 2015-09-30 DIAGNOSIS — F17200 Nicotine dependence, unspecified, uncomplicated: Secondary | ICD-10-CM | POA: Insufficient documentation

## 2015-09-30 NOTE — Progress Notes (Signed)
OFFICE NOTE  Chief Complaint:  Chest pain, cool feet  Primary Care Physician: Leonard Downing, MD  HPI:  [name] is a 60 y.o. female kindly referred to me by Dr. Arelia Sneddon for evaluation of chest pain. She had initially had an appointment to see me in May but missed that appointment is since had some worsening symptoms. She describes a left-sided chest pain under the left breast which radiates down the left arm and up into the jaw. She reports some of her symptoms are rest and other times or with exertion. The left arm pain feels like it's in her bone. She also has fibromyalgia and has typical pain similar to that. She does get short of breath but does have COPD. She's also medication for bipolar disorder and has dyslipidemia GERD and is a long-time smoker. There is a family history of hypertension and heart disease. Recently she was found to have some carotid artery disease and is on statin therapy. She is not taking daily aspirin. She reports some discoloration her feet and was told this was Raynaud's given the coolness and discoloration however she does have decreased pulses.  PMHx:  Past Medical History:  Diagnosis Date  . Anxiety   . Arthritis   . Bipolar 1 disorder ([place])   . COPD (chronic obstructive pulmonary disease) ([place])   . Depression   . ETOH abuse   . Fibromyalgia   . GERD (gastroesophageal reflux disease)   . Hiatal hernia   . Stroke ([place])   . Substance abuse     Past Surgical History:  Procedure Laterality Date  . APPENDECTOMY    . back sx    . CERVICAL FUSION    . laser gum sx    . OVARY SURGERY    . TUBAL LIGATION    . tubaligation      FAMHx:  Family History  Problem Relation Age of Onset  . Heart disease Mother   . Diabetes Father   . Heart disease Father     SOCHx:   reports that she has been smoking Cigarettes.  She has been smoking about 1.00 pack per day. She has never used smokeless tobacco. She reports that she drinks alcohol. Her  drug history is not on file.  ALLERGIES:  No Known Allergies  ROS: Pertinent items noted in HPI and remainder of comprehensive ROS otherwise negative.  HOME MEDS: Current Outpatient Prescriptions  Medication Sig Dispense Refill  . albuterol (PROVENTIL HFA;VENTOLIN HFA) 108 (90 BASE) MCG/ACT inhaler Inhale 2 puffs into the lungs every 6 (six) hours as needed for wheezing or shortness of breath.    Marland Kitchen atorvastatin (LIPITOR) 10 MG tablet Take 10 mg by mouth daily.    Marland Kitchen estrogens, conjugated, (PREMARIN) 1.25 MG tablet Take 1.25 mg by mouth daily. Days 1 through 25    . isosorbide mononitrate (IMDUR) 30 MG 24 hr tablet Take 30 mg by mouth daily.    Marland Kitchen LITHIUM PO Take 1 tablet by mouth daily.    . medroxyPROGESTERone (PROVERA) 10 MG tablet Take 10 mg by mouth daily. Days 16 through 25    . Multiple Vitamin (MULTIVITAMIN WITH MINERALS) TABS tablet Take 1 tablet by mouth daily.    . primidone (MYSOLINE) 50 MG tablet Take 100 mg by mouth at bedtime.     . sulfamethoxazole-trimethoprim (BACTRIM DS) 800-160 MG per tablet Take 1 tablet by mouth 2 (two) times daily.    Marland Kitchen tiotropium (SPIRIVA) 18 MCG inhalation capsule Place 18 mcg  into inhaler and inhale daily as needed.    . topiramate (TOPAMAX) 50 MG tablet Take 50 mg by mouth See admin instructions. Take 1/2 tab every 12 hours then titrate to a maximum of 200 mg every 12 hours    . triamcinolone cream (KENALOG) 0.1 % Apply 1 application topically daily. Apply to arms.     Current Facility-Administered Medications  Medication Dose Route Frequency Provider Last Rate Last Dose  . 0.9 %  sodium chloride infusion  500 mL Intravenous Continuous Sable Feil, MD        LABS/IMAGING: No results found for this or any previous visit (from the past 48 hour(s)). No results found.  WEIGHTS: Wt Readings from Last 3 Encounters:  09/30/15 121 lb (54.9 kg)  03/07/13 101 lb 6.6 oz (46 kg)  06/27/12 89 lb (40.4 kg)    VITALS: BP 136/88   Pulse 80    Ht 5\' 2"  (1.575 m)   Wt 121 lb (54.9 kg)   LMP 02/26/2011   BMI 22.13 kg/m   EXAM: General appearance: alert and no distress Neck: no carotid bruit and no JVD Lungs: diminished breath sounds bilaterally Heart: regular rate and rhythm Abdomen: soft, non-tender; bowel sounds normal; no masses,  no organomegaly and no pulsatile mass Extremities: cool, bluish discoloration Pulses: faint DP/PT pulses Skin: bluish dicoloration of feet Neurologic: Grossly normal Psych: Somewhat hypomanic  EKG: Normal sinus rhythm at 80, nonspecific ST and T-wave changes  ASSESSMENT: 1. Chest pain with typical and atypical features 2. Long-time tobacco abuse 3. COPD 4. Dyslipidemia 5. Carotid artery disease 6. Family history of coronary disease / AAA 7. Decreased pedal pulses without claudication  PLAN: 1.   [name] has been describing some chest pain which is recently worsening. She has some symptoms in her left arm and pain in her jaw but not necessarily worse with chewing. She also has pain related to fibromyalgia in these areas. It's difficult to determine the difference between the 2. She has multiple chronic risk factors and some abnormalities on her EKG which could relate to ischemia. I like for her to undergo a Lexiscan Myoview to evaluate for ischemia. She is unable to walk at a fast rate due to her COPD and shortness of breath. She's also noted have some discoloration and decreased pulses in her legs concerning for PAD. She does have carotid artery disease. I like to obtain lower extremity arterial Dopplers. There is family history of aneurysm and she is a smoker therefore will obtain an abdominal aortic aneurysm screening ultrasound as well.  Follow-up with me to discuss those findings in a few weeks. This again for the kind referral.  Pixie Casino, MD, [facility] Attending Cardiologist [facility] 09/30/2015, 4:38 PM

## 2015-09-30 NOTE — Patient Instructions (Addendum)
Your physician has requested that you have a lower extremity arterial duplex. This test is an ultrasound of the arteries in the legs. It looks at arterial blood flow in the legs. Allow one hour for Lower Arterial scans. There are no restrictions or special instructions  Your physician has requested that you have an abdominal aorta duplex. During this test, an ultrasound is used to evaluate the aorta. Allow 30 minutes for this exam. Do not eat after midnight the day before and avoid carbonated beverages  Your physician has requested that you have a lexiscan myoview. For further information please visit [email or web address]. Please follow instruction sheet, as given.    Your physician recommends that you schedule a follow-up appointment with Dr. Debara Pickett after your test(s)

## 2015-10-01 ENCOUNTER — Telehealth: Payer: Self-pay | Admitting: Internal Medicine

## 2015-10-01 NOTE — Telephone Encounter (Signed)
Thanks,  Dr.H 

## 2015-10-01 NOTE — Telephone Encounter (Signed)
New message        Nurse called yesterday requesting Dr Tretha Sciara office to fax doppler report.  There is no record of pt having a doppler.  She had a mammogram but not doppler.

## 2015-10-01 NOTE — Telephone Encounter (Signed)
Message routed to MD as FYI.

## 2015-10-02 ENCOUNTER — Other Ambulatory Visit: Payer: Self-pay | Admitting: Internal Medicine

## 2015-10-02 DIAGNOSIS — R0989 Other specified symptoms and signs involving the circulatory and respiratory systems: Secondary | ICD-10-CM

## 2015-10-02 DIAGNOSIS — I739 Peripheral vascular disease, unspecified: Secondary | ICD-10-CM

## 2015-10-09 ENCOUNTER — Telehealth (HOSPITAL_COMMUNITY): Payer: Self-pay

## 2015-10-09 NOTE — Telephone Encounter (Signed)
Encounter complete. 

## 2015-10-10 ENCOUNTER — Ambulatory Visit (HOSPITAL_COMMUNITY)
Admission: RE | Admit: 2015-10-10 | Discharge: 2015-10-10 | Disposition: A | Discharge status: Home / Self Care | Payer: Medicaid Other | Source: Ambulatory Visit | Attending: Cardiology | Admitting: Cardiology

## 2015-10-10 DIAGNOSIS — I10 Essential (primary) hypertension: Secondary | ICD-10-CM | POA: Insufficient documentation

## 2015-10-10 DIAGNOSIS — Z720 Tobacco use: Secondary | ICD-10-CM | POA: Diagnosis not present

## 2015-10-10 DIAGNOSIS — R5383 Other fatigue: Secondary | ICD-10-CM | POA: Insufficient documentation

## 2015-10-10 DIAGNOSIS — R0602 Shortness of breath: Secondary | ICD-10-CM | POA: Insufficient documentation

## 2015-10-10 DIAGNOSIS — R079 Chest pain, unspecified: Secondary | ICD-10-CM

## 2015-10-10 DIAGNOSIS — Z8249 Family history of ischemic heart disease and other diseases of the circulatory system: Secondary | ICD-10-CM | POA: Insufficient documentation

## 2015-10-10 DIAGNOSIS — R002 Palpitations: Secondary | ICD-10-CM | POA: Insufficient documentation

## 2015-10-10 LAB — MYOCARDIAL PERFUSION IMAGING
CHL CUP NUCLEAR SRS: 0
CHL CUP NUCLEAR SSS: 2
LV dias vol: 56 mL (ref 46–106)
LVSYSVOL: 21 mL
NUC STRESS TID: 1.18
Peak HR: 103 {beats}/min
Rest HR: 70 {beats}/min
SDS: 2

## 2015-10-10 MED ORDER — REGADENOSON 0.4 MG/5ML IV SOLN
0.4000 mg | Freq: Once | INTRAVENOUS | Status: AC
  Administered 2015-10-10: 0.4 mg via INTRAVENOUS

## 2015-10-10 MED ORDER — AMINOPHYLLINE 25 MG/ML IV SOLN
75.0000 mg | Freq: Once | INTRAVENOUS | Status: AC
  Administered 2015-10-10: 75 mg via INTRAVENOUS

## 2015-10-10 MED ORDER — TECHNETIUM TC 99M TETROFOSMIN IV KIT
11.0000 | PACK | Freq: Once | INTRAVENOUS | Status: AC | PRN
  Administered 2015-10-10: 11 via INTRAVENOUS
  Filled 2015-10-10: qty 11

## 2015-10-10 MED ORDER — TECHNETIUM TC 99M TETROFOSMIN IV KIT
32.0000 | PACK | Freq: Once | INTRAVENOUS | Status: AC | PRN
  Administered 2015-10-10: 32 via INTRAVENOUS
  Filled 2015-10-10: qty 32

## 2015-10-20 ENCOUNTER — Ambulatory Visit (HOSPITAL_COMMUNITY)
Admission: RE | Admit: 2015-10-20 | Discharge: 2015-10-20 | Disposition: A | Discharge status: Home / Self Care | Payer: Medicaid Other | Source: Ambulatory Visit | Attending: Cardiovascular Disease | Admitting: Cardiovascular Disease

## 2015-10-20 DIAGNOSIS — F17200 Nicotine dependence, unspecified, uncomplicated: Secondary | ICD-10-CM

## 2015-10-20 DIAGNOSIS — I739 Peripheral vascular disease, unspecified: Secondary | ICD-10-CM

## 2015-10-20 DIAGNOSIS — R0989 Other specified symptoms and signs involving the circulatory and respiratory systems: Secondary | ICD-10-CM | POA: Insufficient documentation

## 2015-10-20 DIAGNOSIS — Z720 Tobacco use: Secondary | ICD-10-CM | POA: Diagnosis not present

## 2015-11-05 ENCOUNTER — Ambulatory Visit: Payer: Medicaid Other | Admitting: Internal Medicine

## 2016-04-19 ENCOUNTER — Encounter: Payer: Self-pay | Admitting: Internal Medicine

## 2016-05-03 ENCOUNTER — Encounter: Payer: Self-pay | Admitting: Internal Medicine

## 2016-05-03 ENCOUNTER — Ambulatory Visit (INDEPENDENT_AMBULATORY_CARE_PROVIDER_SITE_OTHER): Payer: Medicaid Other | Admitting: Internal Medicine

## 2016-05-03 VITALS — BP 100/80 | HR 97 | Ht 62.0 in | Wt 109.0 lb

## 2016-05-03 DIAGNOSIS — R6884 Jaw pain: Secondary | ICD-10-CM | POA: Diagnosis not present

## 2016-05-03 DIAGNOSIS — F17200 Nicotine dependence, unspecified, uncomplicated: Secondary | ICD-10-CM

## 2016-05-03 DIAGNOSIS — I739 Peripheral vascular disease, unspecified: Secondary | ICD-10-CM

## 2016-05-03 DIAGNOSIS — E782 Mixed hyperlipidemia: Secondary | ICD-10-CM | POA: Diagnosis not present

## 2016-05-03 NOTE — Patient Instructions (Signed)
Your physician recommends that you schedule a follow-up appointment as needed  

## 2016-05-03 NOTE — Progress Notes (Signed)
OFFICE NOTE  Chief Complaint:  Abdominal pain, jaw pain  Primary Care Physician: Leonard Downing, MD  HPI:  [name] is a 61 y.o. female kindly referred to me by Dr. Arelia Sneddon for evaluation of chest pain. She had initially had an appointment to see me in May but missed that appointment is since had some worsening symptoms. She dAbescribes a left-sided chest pain under the left breast which radiates down the left arm and up into the jaw. She reports some of her symptoms are rest and other times or with exertion. The left arm pain feels like it's in her bone. She also has fibromyalgia and has typical pain similar to that. She does get short of breath but does have COPD. She's also medication for bipolar disorder and has dyslipidemia GERD and is a long-time smoker. There is a family history of hypertension and heart disease. Recently she was found to have some carotid artery disease and is on statin therapy. She is not taking daily aspirin. She reports some discoloration her feet and was told this was Raynaud's given the coolness and discoloration however she does have decreased pulses.  05/03/2016  [name] returns for follow-up. She underwent stress testing, AA and LE arterial dopplers in 10/2015.  All of those studies resulted normal. She still complains of left jaw pain and a band-like sensation across her upper abdomen. She was noted to have mild carotid artery disease and is on low-dose statin. This is followed by her primary care provider. In addition she was started on medication for hypertension which is now better controlled.  PMHx:  Past Medical History:  Diagnosis Date  . Anxiety   . Arthritis   . Bipolar 1 disorder ([place])   . COPD (chronic obstructive pulmonary disease) ([place])   . Depression   . ETOH abuse   . Fibromyalgia   . GERD (gastroesophageal reflux disease)   . Hiatal hernia   . Stroke ([place])   . Substance abuse     Past Surgical History:  Procedure  Laterality Date  . APPENDECTOMY    . back sx    . CERVICAL FUSION    . laser gum sx    . OVARY SURGERY    . TUBAL LIGATION    . tubaligation      FAMHx:  Family History  Problem Relation Age of Onset  . Heart disease Mother   . Breast cancer Mother   . Diabetes Father   . Heart disease Father   . Healthy Brother   . Heart disease Maternal Grandmother   . Heart disease Maternal Grandfather   . Hypertension Maternal Grandfather   . Heart disease Paternal Grandmother   . Hypertension Paternal Grandmother   . Heart disease Paternal Grandfather   . Hypertension Paternal Grandfather   . Healthy Brother   . Healthy Brother     SOCHx:   reports that she has been smoking Cigarettes.  She has a 2.00 pack-year smoking history. She has never used smokeless tobacco. She reports that she drinks alcohol. Her drug history is not on file.  ALLERGIES:  No Known Allergies  ROS: Pertinent items noted in HPI and remainder of comprehensive ROS otherwise negative.  HOME MEDS: Current Outpatient Prescriptions  Medication Sig Dispense Refill  . albuterol (PROVENTIL HFA;VENTOLIN HFA) 108 (90 BASE) MCG/ACT inhaler Inhale 2 puffs into the lungs every 6 (six) hours as needed for wheezing or shortness of breath.    . ARIPiprazole (ABILIFY) 15 MG tablet  Take 15 mg by mouth daily.    Marland Kitchen atorvastatin (LIPITOR) 10 MG tablet Take 10 mg by mouth daily.    . Buprenorphine HCl-Naloxone HCl (SUBOXONE) 4-1 MG FILM Place 1 Film under the tongue 2 (two) times daily.    Marland Kitchen lisinopril-hydrochlorothiazide (PRINZIDE,ZESTORETIC) 20-25 MG tablet Take 1 tablet by mouth daily.    . Multiple Vitamin (MULTIVITAMIN WITH MINERALS) TABS tablet Take 1 tablet by mouth daily.    . nitroGLYCERIN (NITROSTAT) 0.4 MG SL tablet Place 0.4 mg under the tongue every 5 (five) minutes as needed for chest pain.    Marland Kitchen tiotropium (SPIRIVA) 18 MCG inhalation capsule Place 18 mcg into inhaler and inhale daily as needed.     Current  Facility-Administered Medications  Medication Dose Route Frequency Provider Last Rate Last Dose  . 0.9 %  sodium chloride infusion  500 mL Intravenous Continuous Sable Feil, MD        LABS/IMAGING: No results found for this or any previous visit (from the past 48 hour(s)). No results found.  WEIGHTS: Wt Readings from Last 3 Encounters:  05/03/16 109 lb (49.4 kg)  10/10/15 121 lb (54.9 kg)  09/30/15 121 lb (54.9 kg)    VITALS: BP 100/80   Pulse 97   Ht 5\' 2"  (1.575 m)   Wt 109 lb (49.4 kg)   LMP 02/26/2011   SpO2 98%   BMI 19.94 kg/m   EXAM: General appearance: alert and no distress Neck: no carotid bruit and no JVD Lungs: diminished breath sounds bilaterally Heart: regular rate and rhythm Abdomen: soft, non-tender; bowel sounds normal; no masses,  no organomegaly and no pulsatile mass Extremities: cool, bluish discoloration Pulses: faint DP/PT pulses Skin: bluish dicoloration of feet Neurologic: Grossly normal Psych: Somewhat hypomanic  EKG: NSR at 86  ASSESSMENT: 1. Chest pain with typical and atypical features - low risk myoview 9/17, EF 63% 2. Long-time tobacco abuse 3. COPD 4. Dyslipidemia 5. Carotid artery disease 6. Family history of coronary disease / AAA 7. Decreased pedal pulses without claudication  PLAN: 1.   Mrs. Spielberg continues to have what sounds like atypical upper left chest discomfort and left jaw pain. She also has a bandlike upper abdominal pain. I was concerned about possible low-grade pancreatitis however she is completely nontender on exam. In addition she could have a O'Leary disease and may need abdominal ultrasound. Percell Miller sign was negative today. [place] we she had no evidence of significant PAD or abnormal aortic aneurysm. She will need intensive lipid-lowering with goal LDL less than 70 given carotid artery disease and risk factors for coronary disease including long-time tobacco abuse.  Pixie Casino, MD, [facility] Attending  Cardiologist [place] 05/03/2016, 8:44 AM

## 2016-05-14 ENCOUNTER — Other Ambulatory Visit: Payer: Self-pay | Admitting: Family Medicine

## 2016-05-17 ENCOUNTER — Other Ambulatory Visit: Payer: Self-pay | Admitting: Family Medicine

## 2016-05-17 DIAGNOSIS — R1010 Upper abdominal pain, unspecified: Secondary | ICD-10-CM

## 2016-05-24 ENCOUNTER — Ambulatory Visit
Admission: RE | Admit: 2016-05-24 | Discharge: 2016-05-24 | Disposition: A | Discharge status: Home / Self Care | Payer: Medicaid Other | Source: Ambulatory Visit | Attending: Family Medicine | Admitting: Family Medicine

## 2016-05-24 DIAGNOSIS — R1010 Upper abdominal pain, unspecified: Secondary | ICD-10-CM

## 2016-06-03 ENCOUNTER — Ambulatory Visit (AMBULATORY_SURGERY_CENTER): Payer: Self-pay

## 2016-06-03 VITALS — Ht 62.5 in | Wt 109.6 lb

## 2016-06-03 DIAGNOSIS — Z860100 Personal history of colon polyps, unspecified: Secondary | ICD-10-CM

## 2016-06-03 MED ORDER — SUPREP BOWEL PREP KIT 17.5-3.13-1.6 GM/177ML PO SOLN: 1.0000 | Freq: Once | ORAL | 0 refills | Status: AC

## 2016-06-03 NOTE — Progress Notes (Signed)
No allergies to eggs or soy No past problems with anesthesia No diet meds No home oxygen  No internet 

## 2016-06-17 ENCOUNTER — Encounter: Payer: Self-pay | Admitting: Internal Medicine

## 2016-06-17 ENCOUNTER — Ambulatory Visit (AMBULATORY_SURGERY_CENTER): Payer: Medicaid Other | Admitting: Internal Medicine

## 2016-06-17 VITALS — BP 103/71 | HR 99 | Temp 97.8°F | Resp 14 | Ht 62.5 in | Wt 109.0 lb

## 2016-06-17 DIAGNOSIS — Z86010 Personal history of colonic polyps: Secondary | ICD-10-CM | POA: Diagnosis not present

## 2016-06-17 DIAGNOSIS — K620 Anal polyp: Secondary | ICD-10-CM

## 2016-06-17 DIAGNOSIS — D122 Benign neoplasm of ascending colon: Secondary | ICD-10-CM

## 2016-06-17 DIAGNOSIS — K6282 Dysplasia of anus: Secondary | ICD-10-CM

## 2016-06-17 MED ORDER — SODIUM CHLORIDE 0.9 % IV SOLN: 500.0000 mL | INTRAVENOUS | Status: DC

## 2016-06-17 NOTE — Patient Instructions (Signed)
YOU HAD AN ENDOSCOPIC PROCEDURE TODAY AT [facility]:   Refer to the procedure report that was given to you for any specific questions about what was found during the examination.  If the procedure report does not answer your questions, please call your gastroenterologist to clarify.  If you requested that your care partner not be given the details of your procedure findings, then the procedure report has been included in a sealed envelope for you to review at your convenience later.  YOU SHOULD EXPECT: Some feelings of bloating in the abdomen. Passage of more gas than usual.  Walking can help get rid of the air that was put into your GI tract during the procedure and reduce the bloating. If you had a lower endoscopy (such as a colonoscopy or flexible sigmoidoscopy) you may notice spotting of blood in your stool or on the toilet paper. If you underwent a bowel prep for your procedure, you may not have a normal bowel movement for a few days.  Please Note:  You might notice some irritation and congestion in your nose or some drainage.  This is from the oxygen used during your procedure.  There is no need for concern and it should clear up in a day or so.  SYMPTOMS TO REPORT IMMEDIATELY:   Following lower endoscopy (colonoscopy or flexible sigmoidoscopy):  Excessive amounts of blood in the stool  Significant tenderness or worsening of abdominal pains  Swelling of the abdomen that is new, acute  Fever of 100F or higher  For urgent or emergent issues, a gastroenterologist can be reached at any hour by calling [phone].   DIET:  We do recommend a small meal at first, but then you may proceed to your regular diet.  Drink plenty of fluids but you should avoid alcoholic beverages for 24 hours.  MEDICATIONS: Continue present medications.  Please see handout on polyps given to you by your recovery nurse.  ACTIVITY:  You should plan to take it easy for the rest of today and you  should NOT DRIVE or use heavy machinery until tomorrow (because of the sedation medicines used during the test).    FOLLOW UP: Our staff will call the number listed on your records the next business day following your procedure to check on you and address any questions or concerns that you may have regarding the information given to you following your procedure. If we do not reach you, we will leave a message.  However, if you are feeling well and you are not experiencing any problems, there is no need to return our call.  We will assume that you have returned to your regular daily activities without incident.  If any biopsies were taken you will be contacted by phone or by letter within the next 1-3 weeks.  Please call us at [phone] if you have not heard about the biopsies in 3 weeks.   Thank you for allowing [place] to provide for your healthcare needs today.   SIGNATURES/CONFIDENTIALITY: You and/or your care partner have signed paperwork which will be entered into your electronic medical record.  These signatures attest to the fact that that the information above on your After Visit Summary has been reviewed and is understood.  Full responsibility of the confidentiality of this discharge information lies with you and/or your care-partner.

## 2016-06-17 NOTE — Op Note (Signed)
[place] Patient Name: [name] Procedure Date: 06/17/2016 10:17 AM MRN: [id number] Endoscopist: Jerene Bears , MD Age: 61 Referring MD:  Date of Birth: [birth year] Gender: Female Account #: [id number] Procedure:                Colonoscopy Indications:              High risk colon cancer surveillance: Personal                            history of non-advanced adenoma, Last colonoscopy:                            2012 Medicines:                Monitored Anesthesia Care Procedure:                Pre-Anesthesia Assessment:                           - Prior to the procedure, a History and Physical                            was performed, and patient medications and                            allergies were reviewed. The patient's tolerance of                            previous anesthesia was also reviewed. The risks                            and benefits of the procedure and the sedation                            options and risks were discussed with the patient.                            All questions were answered, and informed consent                            was obtained. Prior Anticoagulants: The patient has                            taken no previous anticoagulant or antiplatelet                            agents. ASA Grade Assessment: III - A patient with                            severe systemic disease. After reviewing the risks                            and benefits, the patient was deemed in  satisfactory condition to undergo the procedure.                           After obtaining informed consent, the colonoscope                            was passed under direct vision. Throughout the                            procedure, the patient's blood pressure, pulse, and                            oxygen saturations were monitored continuously. The                            Colonoscope was introduced through the anus and                       advanced to the the cecum, identified by                            appendiceal orifice and ileocecal valve. The                            colonoscopy was performed without difficulty. The                            patient tolerated the procedure well. The quality                            of the bowel preparation was good. The ileocecal                            valve, appendiceal orifice, and rectum were                            photographed. Scope In: 10:22:43 AM Scope Out: 09:81:19 AM Scope Withdrawal Time: 0 hours 8 minutes 35 seconds  Total Procedure Duration: 0 hours 13 minutes 35 seconds  Findings:                 The digital rectal exam was normal.                           A 6 mm polyp was found in the ascending colon. The                            polyp was sessile. The polyp was removed with a                            cold snare. Resection and retrieval were complete.                           A 4 mm polyp was found in the anus. The polyp was  sessile. Biopsy was taken with a cold forceps for                            histology and to rule out AIN.                           The exam was otherwise without abnormality. Complications:            No immediate complications. Estimated Blood Loss:     Estimated blood loss was minimal. Impression:               - One 6 mm polyp in the ascending colon, removed                            with a cold snare. Resected and retrieved.                           - One 4 mm polyp at the anus. Biopsied to exclude                            AIN.                           - The examination was otherwise normal. Recommendation:           - Patient has a contact number available for                            emergencies. The signs and symptoms of potential                            delayed complications were discussed with the                            patient. Return to normal activities  tomorrow.                            Written discharge instructions were provided to the                            patient.                           - Resume previous diet.                           - Continue present medications.                           - Await pathology results.                           - Repeat colonoscopy is recommended. The                            colonoscopy date will be determined after pathology  results from today's exam become available for                            review. Jerene Bears, MD 06/17/2016 10:46:18 AM This report has been signed electronically.

## 2016-06-17 NOTE — Progress Notes (Signed)
Pt's states no medical or surgical changes since previsit or office visit. 

## 2016-06-17 NOTE — Progress Notes (Signed)
Called to room to assist during endoscopic procedure.  Patient ID and intended procedure confirmed with present staff. Received instructions for my participation in the procedure from the performing physician.  

## 2016-06-17 NOTE — Progress Notes (Signed)
Report given to PACU, vss 

## 2016-06-18 ENCOUNTER — Telehealth: Payer: Self-pay | Admitting: *Deleted

## 2016-06-18 NOTE — Telephone Encounter (Signed)
  Follow up Call-  Call back number 06/17/2016  Post procedure Call Back phone  # [phone]  Permission to leave phone message Yes  Some recent data might be hidden     Patient questions:  Do you have a fever, pain , or abdominal swelling? No. Pain Score  0 *  Have you tolerated food without any problems? Yes.    Have you been able to return to your normal activities? Yes.    Do you have any questions about your discharge instructions: Diet   No. Medications  No. Follow up visit  No.  Do you have questions or concerns about your Care? No.  Actions: * If pain score is 4 or above: No action needed, pain <4.

## 2016-06-22 ENCOUNTER — Encounter: Payer: Self-pay | Admitting: Internal Medicine

## 2016-06-23 ENCOUNTER — Telehealth: Payer: Self-pay | Admitting: Internal Medicine

## 2016-06-23 NOTE — Telephone Encounter (Signed)
Spoke with pt and reviewed her path report and explained that the anal polyp that was removed had some dysplasia present and Dr. Hilarie Fredrickson feels this needs to be removed. Pt verbalized understanding and thanked me for the call.

## 2016-06-24 ENCOUNTER — Telehealth: Payer: Self-pay

## 2016-06-24 NOTE — Telephone Encounter (Signed)
Pt is scheduled to see Dr. Johney Maine with [facility] 07/19/16 @2 :30pm, pt to arrive at 2pm.

## 2016-07-19 ENCOUNTER — Ambulatory Visit: Payer: Self-pay | Admitting: Surgery

## 2016-07-20 ENCOUNTER — Ambulatory Visit: Payer: Self-pay | Admitting: Surgery

## 2016-07-20 NOTE — H&P (Signed)
[name] 07/19/2016 2:56 PM Location: [facility] Patient #: [id number] DOB: 29-Aug-1955 Divorced / Language: Cleophus Molt / Race: White Female  History of Present Illness Adin Hector MD; 07/20/2016 8:22 AM) The patient is a 61 year old female who presents with anal lesions. Note for "Anal lesions": ` ` ` Patient sent for surgical consultation at the request of Dr. Hilarie Fredrickson  Chief Complaint: Polyp in anal canal with dysplasia  The patient is a smoking female with history of colon polyps. 15 year colonoscopy follow-up, proximal colon polyp removed. Mucosal lesion and anal canal noted. 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 Surgical consultation requested for removal given its low and sensitive location. Patient tends to have constipation. Moves her bowels once or twice a week. She had a tubal ligation but no other surgery. She can walk a half hour without difficulty. She has had some atypical chest pain and numbness but cardiac workup by Dr. Debara Pickett earlier this year was underwhelming. She does not recall any history of warts or condyloma. No history of any hemorrhoid or other anorectal issues.  No personal nor family history of GI/colon cancer, inflammatory bowel disease, irritable bowel syndrome, allergy such as Celiac Sprue, dietary/dairy problems, colitis, ulcers nor gastritis. No recent sick contacts/gastroenteritis. No travel outside the country. No changes in diet. No dysphagia to solids or liquids. No significant heartburn or reflux. No hematochezia, hematemesis, coffee ground emesis. No evidence of prior gastric/peptic ulceration.   Diagnosis 1. Surgical [P], ascending, polyp - SESSILE SERRATED ADENOMA (ONE FRAGMENT). - NO HIGH GRADE DYSPLASIA OR MALIGNANCY. 2. 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 - SEE MICROSCOPIC DESCRIPTION. Microscopic Comment 2. There is superficial squamous  mucosa with features of AIN-2/3. There is minimal stroma present and invasive carcinoma is not identified. (JDP:ah 06/22/16) Claudette Laws MD Pathologist, Electronic Signature (Case signed 06/22/2016)  (Review of systems as stated in this history (HPI) or in the review of systems. Otherwise all other 12 point ROS are negative)   Past Surgical History Dalbert Mayotte, [place]; 07/19/2016 2:56 PM) Appendectomy Oral Surgery  Diagnostic Studies History Dalbert Mayotte, [place]; 07/19/2016 2:56 PM) Colonoscopy within last year Mammogram within last year Pap Smear 1-5 years ago  Allergies Dalbert Mayotte, CMA; 07/19/2016 2:58 PM) Codeine/Codeine Derivatives  Medication History Dalbert Mayotte, CMA; 07/19/2016 2:59 PM) Lisinopril-Hydrochlorothiazide (20-25MG  Tablet, Oral) Active. Atorvastatin Calcium (10MG  Tablet, Oral) Active. ARIPiprazole (30MG  Tablet, Oral) Active. Suboxone (4-1MG  Film, Sublingual) Active. Nitroglycerin (Sublingual) Specific strength unknown - Active. Proventil (Inhalation) Specific strength unknown - Active. Medications Reconciled  Social History Dalbert Mayotte, [place]; 07/19/2016 2:56 PM) Alcohol use Occasional alcohol use. Caffeine use Carbonated beverages, Coffee. Tobacco use Current every day smoker.  Family History Dalbert Mayotte, [place]; 07/19/2016 2:56 PM) Breast Cancer Mother. Heart disease in female family member before age 63 Hypertension Mother.  Pregnancy / Birth History Dalbert Mayotte, [place]; 07/19/2016 2:56 PM) Age at menarche 71 years. Age of menopause 39-50 Contraceptive History Oral contraceptives. Gravida 2 Irregular periods Maternal age 29-25 Para 15  Other Problems Dalbert Mayotte, [place]; 07/19/2016 2:56 PM) Alcohol Abuse Anxiety Disorder Back Pain Cerebrovascular Accident Chronic Obstructive Lung Disease Depression Emphysema Of Lung High blood pressure Oophorectomy Bilateral. Seizure Disorder     Review of  Systems Dalbert Mayotte CMA; 07/19/2016 2:56 PM) General Present- Chills. Not Present- Appetite Loss, Fatigue, Fever, Night Sweats, Weight Gain and Weight Loss. Skin Present- Change in Wart/Mole. Not Present- Dryness, Hives, Jaundice, New Lesions, Non-Healing Wounds, Rash and Ulcer.  HEENT Present- Wears glasses/contact lenses. Not Present- Earache, Hearing Loss, Hoarseness, Nose Bleed, Oral Ulcers, Ringing in the Ears, Seasonal Allergies, Sinus Pain, Sore Throat, Visual Disturbances and Yellow Eyes. Respiratory Present- Snoring. Not Present- Bloody sputum, Chronic Cough, Difficulty Breathing and Wheezing. Breast Not Present- Breast Mass, Breast Pain, Nipple Discharge and Skin Changes. Cardiovascular Present- Chest Pain and Swelling of Extremities. Not Present- Difficulty Breathing Lying Down, Leg Cramps, Palpitations, Rapid Heart Rate and Shortness of Breath. Gastrointestinal Present- Abdominal Pain. Not Present- Bloating, Bloody Stool, Change in Bowel Habits, Chronic diarrhea, Constipation, Difficulty Swallowing, Excessive gas, Gets full quickly at meals, Hemorrhoids, Indigestion, Nausea, Rectal Pain and Vomiting. Female Genitourinary Not Present- Frequency, Nocturia, Painful Urination, Pelvic Pain and Urgency. Musculoskeletal Present- Back Pain, Joint Pain, Joint Stiffness, Muscle Pain and Swelling of Extremities. Not Present- Muscle Weakness. Neurological Not Present- Decreased Memory, Fainting, Headaches, Numbness, Seizures, Tingling, Tremor, Trouble walking and Weakness. Psychiatric Present- Anxiety, Bipolar, Change in Sleep Pattern and Depression. Not Present- Fearful and Frequent crying. Endocrine Present- Cold Intolerance and Heat Intolerance. Not Present- Excessive Hunger, Hair Changes, Hot flashes and New Diabetes. Hematology Not Present- Blood Thinners, Easy Bruising, Excessive bleeding, Gland problems, HIV and Persistent Infections.  Vitals Dalbert Mayotte CMA; 07/19/2016 3:00 PM) 07/19/2016  2:59 PM Weight: 112.4 lb Height: 62in Body Surface Area: 1.5 m Body Mass Index: 20.56 kg/m  Temp.: 97.45F  Pulse: 77 (Regular)  BP: 100/60 (Sitting, Left Arm, Standard)      Physical Exam Adin Hector MD; 07/20/2016 8:19 AM)  General Mental Status-Alert. General Appearance-Not in acute distress, Not Sickly. Orientation-Oriented X3. Hydration-Well hydrated. Voice-Normal.  Integumentary Global Assessment Upon inspection and palpation of skin surfaces of the - Axillae: non-tender, no inflammation or ulceration, no drainage. and Distribution of scalp and body hair is normal. General Characteristics Temperature - normal warmth is noted.  Head and Neck Head-normocephalic, atraumatic with no lesions or palpable masses. Face Global Assessment - atraumatic, no absence of expression. Neck Global Assessment - no abnormal movements, no bruit auscultated on the right, no bruit auscultated on the left, no decreased range of motion, non-tender. Trachea-midline. Thyroid Gland Characteristics - non-tender.  Eye Eyeball - Left-Extraocular movements intact, No Nystagmus. Eyeball - Right-Extraocular movements intact, No Nystagmus. Cornea - Left-No Hazy. Cornea - Right-No Hazy. Sclera/Conjunctiva - Left-No scleral icterus, No Discharge. Sclera/Conjunctiva - Right-No scleral icterus, No Discharge. Pupil - Left-Direct reaction to light normal. Pupil - Right-Direct reaction to light normal.  ENMT Ears Pinna - Left - no drainage observed, no generalized tenderness observed. Right - no drainage observed, no generalized tenderness observed. Nose and Sinuses External Inspection of the Nose - no destructive lesion observed. Inspection of the nares - Left - quiet respiration. Right - quiet respiration. Mouth and Throat Lips - Upper Lip - no fissures observed, no pallor noted. Lower Lip - no fissures observed, no pallor noted. Nasopharynx - no  discharge present. Oral Cavity/Oropharynx - Tongue - no dryness observed. Oral Mucosa - no cyanosis observed. Hypopharynx - no evidence of airway distress observed.  Chest and Lung Exam Inspection Movements - Normal and Symmetrical. Accessory muscles - No use of accessory muscles in breathing. Palpation Palpation of the chest reveals - Non-tender. Auscultation Breath sounds - Normal and Clear.  Cardiovascular Auscultation Rhythm - Regular. Murmurs & Other Heart Sounds - Auscultation of the heart reveals - No Murmurs and No Systolic Clicks.  Abdomen Inspection Inspection of the abdomen reveals - No Visible peristalsis and No Abnormal pulsations. Umbilicus - No Bleeding, No  Urine drainage. Palpation/Percussion Palpation and Percussion of the abdomen reveal - Soft, Non Tender, No Rebound tenderness, No Rigidity (guarding) and No Cutaneous hyperesthesia. Note: Abdomen soft. Not severely distended. No distasis recti. No umbilical or other anterior abdominal wall hernias  Female Genitourinary Sexual Maturity Tanner 5 - Adult hair pattern. Note: No vaginal bleeding nor discharge  Rectal Note: Please refer to anoscopy. Anterior third of anal canal circumference with some palpable and visual abnormality concerning for area of the AIN/anal papilloma  Peripheral Vascular Upper Extremity Inspection - Left - No Cyanotic nailbeds, Not Ischemic. Right - No Cyanotic nailbeds, Not Ischemic.  Neurologic Neurologic evaluation reveals -normal attention span and ability to concentrate, able to name objects and repeat phrases. Appropriate fund of knowledge , normal sensation and normal coordination. Mental Status Affect - not angry, not paranoid. Cranial Nerves-Normal Bilaterally. Gait-Normal.  Neuropsychiatric Mental status exam performed with findings of-able to articulate well with normal speech/language, rate, volume and coherence, thought content normal with ability to perform  basic computations and apply abstract reasoning and no evidence of hallucinations, delusions, obsessions or homicidal/suicidal ideation.  Musculoskeletal Global Assessment Spine, Ribs and Pelvis - no instability, subluxation or laxity. Right Upper Extremity - no instability, subluxation or laxity.  Lymphatic Head & Neck  General Head & Neck Lymphatics: Bilateral - Description - No Localized lymphadenopathy. Axillary  General Axillary Region: Bilateral - Description - No Localized lymphadenopathy. Femoral & Inguinal  Generalized Femoral & Inguinal Lymphatics: Left - Description - No Localized lymphadenopathy. Right - Description - No Localized lymphadenopathy.    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 Sounds like AIN III to me. A little too proximal to deal superficially. Standard of care would be examination under anesthesia with local excision. Acetic acid staining and possible laser ablation if needed. Outpatient surgery. She wishes to be aggressive and consider  Current Plans ANOSCOPY, DIAGNOSTIC (44010) You are being scheduled for surgery- Our schedulers will call you.  You should hear from our office's scheduling department within 5 working days about the location, date, and time of surgery. We try to make accommodations for patient's preferences in scheduling surgery, but sometimes the OR schedule or the surgeon's schedule prevents [place] from making those accommodations.  If you have not heard from our office [phone] in 5 working days, call the office and ask for your surgeon's nurse.  If you have other questions about your diagnosis, plan, or surgery, call the office and ask for your surgeon's nurse.  The anatomy & physiology of the anorectal region was discussed. The pathophysiology of anorectal warts,  anal intraepoithelial neoplasia, and differential diagnosis was discussed. Natural history risks without surgery was discussed such as further growth and cancer. I stressed the importance of office follow-up to catch early recurrence & minimize/halt progression of disease. Interventions such as cauterization or cryotherapy by topical agents were discussed.  The patient's symptoms are not adequately controlled by non-operative treatments. I feel the risks & problems of no surgery outweigh the operative risks; therefore, I recommended surgery to treat the anal lesion by removal, ablation and/or cauterization.  Risks such as bleeding, infection, need for further treatment, heart attack, death, and other risks were discussed. I noted a good likelihood this will help address the problem. Goals of post-operative recovery were discussed as well. Possibility that this will not correct all symptoms was explained. Post-operative pain, bleeding, constipation,  and other problems after surgery were discussed. We will work to minimize complications. Educational handouts further explaining the pathology, treatment options, and bowel regimen were given as well. Questions were answered. The patient expresses understanding & wishes to proceed with surgery.  ENCOUNTER FOR PREOPERATIVE EXAMINATION FOR GENERAL SURGICAL PROCEDURE (Z01.818)  Current Plans You are being scheduled for surgery- Our schedulers will call you.  You should hear from our office's scheduling department within 5 working days about the location, date, and time of surgery. We try to make accommodations for patient's preferences in scheduling surgery, but sometimes the OR schedule or the surgeon's schedule prevents [place] from making those accommodations.  If you have not heard from our office [phone]) in 5 working days, call the office and ask for your surgeon's nurse.  If you have other questions about your diagnosis, plan, or  surgery, call the office and ask for your surgeon's nurse.  The anatomy and the physiology was discussed. The pathophysiology and natural history of the disease was discussed. Options were discussed and recommendations were made. Technique, risks, benefits, & alternatives were discussed. Risks such as stroke, heart attack, bleeding, indection, death, and other risks discussed. Questions answered. The patient agrees to proceed. Pt Education - [facility] Rectal Prep for Anorectal outpatient/office surgery: discussed with patient and provided information. Pt Education - [facility] Rectal Surgery HCI (Fritzi Scripter): discussed with patient and provided information. CHRONIC CONSTIPATION (K59.09) Impression: Recommend that she have a regular fiber supplement to minimize further long-term problems with constipation.  Current Plans Pt Education - [facility] Constipation (AT) Pt Education - [facility] Good Bowel Health (Kwesi Sangha) TOBACCO ABUSE (Z72.0)  Current Plans Pt Education - [facility] STOP SMOKING! HISTORY OF COLON POLYPS (Z86.010)  Current Plans Pt Education - Polyps in the Colon and Rectum (Colonic and Rectal Polyps): colonic polyps  Adin Hector, M.D., F.A.C.S. Gastrointestinal and Minimally Invasive Surgery [facility] [address] [place], [place] [address] [phone] Main / Paging

## 2016-07-23 ENCOUNTER — Encounter: Payer: Self-pay | Admitting: Internal Medicine

## 2016-07-26 ENCOUNTER — Telehealth: Payer: Self-pay | Admitting: Internal Medicine

## 2016-07-26 NOTE — Telephone Encounter (Signed)
Faxed clearance letter (in epic) + EKG + last office note to [facility] [place] [facility]

## 2016-07-30 ENCOUNTER — Other Ambulatory Visit: Payer: Self-pay | Admitting: Surgery

## 2018-01-30 ENCOUNTER — Other Ambulatory Visit: Payer: Self-pay | Admitting: Family Medicine

## 2018-01-30 DIAGNOSIS — Z1231 Encounter for screening mammogram for malignant neoplasm of breast: Secondary | ICD-10-CM

## 2018-02-02 ENCOUNTER — Other Ambulatory Visit: Payer: Self-pay | Admitting: Family Medicine

## 2018-02-02 ENCOUNTER — Ambulatory Visit
Admission: RE | Admit: 2018-02-02 | Discharge: 2018-02-02 | Disposition: A | Discharge status: Home / Self Care | Payer: Medicaid Other | Source: Ambulatory Visit | Attending: Family Medicine | Admitting: Family Medicine

## 2018-02-02 DIAGNOSIS — M79622 Pain in left upper arm: Secondary | ICD-10-CM

## 2018-02-02 DIAGNOSIS — M898X2 Other specified disorders of bone, upper arm: Secondary | ICD-10-CM

## 2018-02-15 ENCOUNTER — Ambulatory Visit
Admission: RE | Admit: 2018-02-15 | Discharge: 2018-02-15 | Disposition: A | Discharge status: Home / Self Care | Payer: Medicaid Other | Source: Ambulatory Visit | Attending: Family Medicine | Admitting: Family Medicine

## 2018-02-15 DIAGNOSIS — Z1231 Encounter for screening mammogram for malignant neoplasm of breast: Secondary | ICD-10-CM

## 2018-08-02 ENCOUNTER — Ambulatory Visit
Admission: RE | Admit: 2018-08-02 | Discharge: 2018-08-02 | Disposition: A | Discharge status: Home / Self Care | Payer: Medicaid Other | Source: Ambulatory Visit | Attending: Family Medicine | Admitting: Family Medicine

## 2018-08-02 ENCOUNTER — Other Ambulatory Visit: Payer: Self-pay | Admitting: Family Medicine

## 2018-08-02 ENCOUNTER — Other Ambulatory Visit: Payer: Self-pay

## 2018-08-02 DIAGNOSIS — R609 Edema, unspecified: Secondary | ICD-10-CM

## 2018-10-23 ENCOUNTER — Other Ambulatory Visit: Payer: Self-pay

## 2018-10-23 ENCOUNTER — Ambulatory Visit
Admission: RE | Admit: 2018-10-23 | Discharge: 2018-10-23 | Disposition: A | Discharge status: Home / Self Care | Payer: Medicaid Other | Source: Ambulatory Visit | Attending: Family Medicine | Admitting: Family Medicine

## 2018-10-23 ENCOUNTER — Other Ambulatory Visit: Payer: Self-pay | Admitting: Family Medicine

## 2018-10-23 DIAGNOSIS — R509 Fever, unspecified: Secondary | ICD-10-CM

## 2019-01-02 ENCOUNTER — Ambulatory Visit
Admission: RE | Admit: 2019-01-02 | Discharge: 2019-01-02 | Disposition: A | Discharge status: Home / Self Care | Payer: Medicaid Other | Source: Ambulatory Visit | Attending: Family Medicine | Admitting: Family Medicine

## 2019-01-02 ENCOUNTER — Other Ambulatory Visit: Payer: Self-pay | Admitting: Family Medicine

## 2019-01-02 DIAGNOSIS — R52 Pain, unspecified: Secondary | ICD-10-CM

## 2019-03-02 ENCOUNTER — Other Ambulatory Visit: Payer: Self-pay | Admitting: Nurse Practitioner

## 2019-03-02 DIAGNOSIS — N950 Postmenopausal bleeding: Secondary | ICD-10-CM

## 2019-03-05 ENCOUNTER — Ambulatory Visit
Admission: RE | Admit: 2019-03-05 | Discharge: 2019-03-05 | Disposition: A | Discharge status: Home / Self Care | Payer: Medicaid Other | Source: Ambulatory Visit | Attending: Nurse Practitioner | Admitting: Nurse Practitioner

## 2019-03-05 DIAGNOSIS — N950 Postmenopausal bleeding: Secondary | ICD-10-CM

## 2019-05-02 ENCOUNTER — Other Ambulatory Visit: Payer: Self-pay | Admitting: Family Medicine

## 2019-05-02 ENCOUNTER — Ambulatory Visit
Admission: RE | Admit: 2019-05-02 | Discharge: 2019-05-02 | Disposition: A | Discharge status: Home / Self Care | Payer: Medicaid Other | Source: Ambulatory Visit | Attending: Family Medicine | Admitting: Family Medicine

## 2019-05-02 DIAGNOSIS — M25571 Pain in right ankle and joints of right foot: Secondary | ICD-10-CM

## 2019-05-02 DIAGNOSIS — M25572 Pain in left ankle and joints of left foot: Secondary | ICD-10-CM

## 2019-08-29 ENCOUNTER — Other Ambulatory Visit: Payer: Self-pay

## 2019-08-29 ENCOUNTER — Emergency Department (HOSPITAL_COMMUNITY)
Admission: EM | Admit: 2019-08-29 | Discharge: 2019-08-29 | Disposition: A | Discharge status: Home / Self Care | Payer: Medicaid Other | Attending: Emergency Medicine | Admitting: Emergency Medicine

## 2019-08-29 ENCOUNTER — Emergency Department (HOSPITAL_COMMUNITY): Payer: Medicaid Other

## 2019-08-29 ENCOUNTER — Encounter (HOSPITAL_COMMUNITY): Payer: Self-pay

## 2019-08-29 DIAGNOSIS — Y929 Unspecified place or not applicable: Secondary | ICD-10-CM | POA: Diagnosis not present

## 2019-08-29 DIAGNOSIS — S82091A Other fracture of right patella, initial encounter for closed fracture: Secondary | ICD-10-CM | POA: Insufficient documentation

## 2019-08-29 DIAGNOSIS — Y939 Activity, unspecified: Secondary | ICD-10-CM | POA: Insufficient documentation

## 2019-08-29 DIAGNOSIS — W010XXA Fall on same level from slipping, tripping and stumbling without subsequent striking against object, initial encounter: Secondary | ICD-10-CM | POA: Diagnosis not present

## 2019-08-29 DIAGNOSIS — J449 Chronic obstructive pulmonary disease, unspecified: Secondary | ICD-10-CM | POA: Insufficient documentation

## 2019-08-29 DIAGNOSIS — F17210 Nicotine dependence, cigarettes, uncomplicated: Secondary | ICD-10-CM | POA: Insufficient documentation

## 2019-08-29 DIAGNOSIS — Z79899 Other long term (current) drug therapy: Secondary | ICD-10-CM | POA: Diagnosis not present

## 2019-08-29 DIAGNOSIS — Y999 Unspecified external cause status: Secondary | ICD-10-CM | POA: Diagnosis not present

## 2019-08-29 DIAGNOSIS — S82001A Unspecified fracture of right patella, initial encounter for closed fracture: Secondary | ICD-10-CM

## 2019-08-29 DIAGNOSIS — S8991XA Unspecified injury of right lower leg, initial encounter: Secondary | ICD-10-CM | POA: Diagnosis present

## 2019-08-29 DIAGNOSIS — M26629 Arthralgia of temporomandibular joint, unspecified side: Secondary | ICD-10-CM | POA: Diagnosis not present

## 2019-08-29 DIAGNOSIS — W19XXXA Unspecified fall, initial encounter: Secondary | ICD-10-CM

## 2019-08-29 MED ORDER — HYDROCODONE-ACETAMINOPHEN 5-325 MG PO TABS: 1.0000 | ORAL_TABLET | Freq: Four times a day (QID) | ORAL | 0 refills | Status: DC | PRN

## 2019-08-29 MED ORDER — HYDROCODONE-ACETAMINOPHEN 5-325 MG PO TABS
1.0000 | ORAL_TABLET | Freq: Once | ORAL | Status: AC
  Administered 2019-08-29: 1 via ORAL
  Filled 2019-08-29: qty 1

## 2019-08-29 NOTE — Medical Student Note (Signed)
[facility] DEPT Provider Student Note For educational purposes for Medical, PA and NP students only and not part of the legal medical record.   CSN: [id number] Arrival date & time: 08/29/19  1000      History   Chief Complaint Chief Complaint  Patient presents with  . Knee Injury    HPI [name] is a 64 y.o. female presenting with right knee pain since yesterday. She has history of HTN, COPD, GERD, fibromyalgia, stroke. She reports that yesterday while in the hospital with her mom for cataract surgery, she tripped over herself in the hallway and landed on her right knee and hit her forehead on the floor. She denies falls like this in the past, denies loss of consciousness, denies seeing spots or loss of vision. She reports that since the fall yesterday she has had sharp constant pain and has been using a walker to assist with ambulation, and has been trying to not bear weight. She says that the pain is mostly at the superior aspect of her patella and is radiating caudally up her medial thigh. She reports that at home she has been elevating her right knee and also taking old Norco that she had been given prior. She says that this provides some pain relief but doesn't totally eliminate her pain. She reports that she lives with boyfriend who is able to help her with ADLs during injury. Does not typically use walker to ambulate.  She denies use of blood thinners, and admits to taking htn medication. Reports hx of knee and ankle fractures in the past not requiring surgery.   HPI  Past Medical History:  Diagnosis Date  . Anxiety   . Arthritis   . Bipolar 1 disorder ([place])   . COPD (chronic obstructive pulmonary disease) ([place])   . Depression   . ETOH abuse   . Fibromyalgia   . GERD (gastroesophageal reflux disease)   . Hiatal hernia   . Stroke ([place])   . Substance abuse [facility])     Patient Active Problem List   Diagnosis Date Noted  . Smoker 09/30/2015  . Decreased pedal  pulses 09/30/2015  . Chest pain, unspecified 09/30/2015  . PAD (peripheral artery disease) ([place]) 09/30/2015  . Hyperlipidemia 09/30/2015  . Seizure ([place]) 03/07/2013  . Alcohol withdrawal delirium ([place]) 03/07/2013  . Protein-calorie malnutrition, severe ([place]) 03/07/2013  . Duodenitis 05/06/2010  . Colon polyps 05/06/2010  . Guaiac positive stools 05/06/2010  . Diverticulosis of colon 05/06/2010  . ANXIETY 04/02/2010  . ALCOHOLISM 04/02/2010  . GERD 04/02/2010  . ARTHRITIS 04/02/2010  . FECAL OCCULT BLOOD 04/02/2010  . UTI'S, HX OF 04/02/2010  . BIPOLAR AFFECTIVE DISORDER 04/01/2010  . SUBSTANCE ABUSE, MULTIPLE 04/01/2010  . DEPRESSION 04/01/2010  . COPD 04/01/2010  . FIBROMYALGIA 04/01/2010    Past Surgical History:  Procedure Laterality Date  . APPENDECTOMY    . back sx    . CERVICAL FUSION    . laser gum sx    . OVARY SURGERY    . TUBAL LIGATION    . tubaligation      OB History   No obstetric history on file.      Home Medications    Prior to Admission medications   Medication Sig Start Date End Date Taking? Authorizing Provider  albuterol (PROVENTIL HFA;VENTOLIN HFA) 108 (90 BASE) MCG/ACT inhaler Inhale 2 puffs into the lungs every 6 (six) hours as needed for wheezing or shortness of breath.    [provider]  ARIPiprazole (ABILIFY) 15 MG tablet Take 15 mg by mouth daily.    [provider]  atorvastatin (LIPITOR) 10 MG tablet Take 10 mg by mouth daily.    [provider]  Buprenorphine HCl-Naloxone HCl (SUBOXONE) 4-1 MG FILM Place 1 Film under the tongue 2 (two) times daily.    [provider]  HYDROcodone-acetaminophen (NORCO/VICODIN) 5-325 MG tablet Take 1 tablet by mouth every 6 (six) hours as needed. 08/29/19   Caccavale, Sophia, PA-C  lisinopril-hydrochlorothiazide (PRINZIDE,ZESTORETIC) 20-25 MG tablet Take 1 tablet by mouth daily.    [provider]  Multiple Vitamin (MULTIVITAMIN WITH MINERALS) TABS tablet Take  1 tablet by mouth daily.    [provider]  nitroGLYCERIN (NITROSTAT) 0.4 MG SL tablet Place 0.4 mg under the tongue every 5 (five) minutes as needed for chest pain.    [provider]  tiotropium (SPIRIVA) 18 MCG inhalation capsule Place 18 mcg into inhaler and inhale daily as needed.    [provider]    Family History Family History  Problem Relation Age of Onset  . Heart disease Mother   . Breast cancer Mother   . Diabetes Father   . Heart disease Father   . Healthy Brother   . Heart disease Maternal Grandmother   . Heart disease Maternal Grandfather   . Hypertension Maternal Grandfather   . Heart disease Paternal Grandmother   . Hypertension Paternal Grandmother   . Heart disease Paternal Grandfather   . Hypertension Paternal Grandfather   . Healthy Brother   . Healthy Brother   . Colon cancer Neg Hx     Social History Social History   Tobacco Use  . Smoking status: Current Every Day Smoker    Packs/day: 0.05    Years: 40.00    Pack years: 2.00    Types: Cigarettes  . Smokeless tobacco: Never Used  Vaping Use  . Vaping Use: Never used  Substance Use Topics  . Alcohol use: Yes    Alcohol/week: 1.0 - 2.0 standard drink    Types: 1 - 2 Standard drinks or equivalent per week    Comment: 5th of vodka daily; pt considers this to be "casual"  . Drug use: No    Comment: crack     Allergies   Codeine   Review of Systems Review of Systems  Constitutional: Negative for activity change, fatigue and fever.  Respiratory: Negative for shortness of breath.   Cardiovascular: Negative for chest pain and leg swelling.  Gastrointestinal: Negative for abdominal distention and abdominal pain.  Musculoskeletal: Positive for joint swelling (right knee).  Skin: Positive for color change (erythema of right patella ). Negative for rash and wound.  Neurological: Negative for dizziness, syncope and headaches.     Physical Exam Updated Vital  Signs BP 117/68   Pulse 95   Temp 98.5 F (36.9 C)   LMP 02/26/2011   SpO2 92%   Physical Exam Constitutional:      Appearance: Normal appearance. She is not toxic-appearing.  HENT:     Head: Normocephalic.  Eyes:     Extraocular Movements: Extraocular movements intact.  Cardiovascular:     Rate and Rhythm: Normal rate and regular rhythm.     Pulses: Normal pulses.     Heart sounds: Normal heart sounds.  Pulmonary:     Effort: Pulmonary effort is normal.     Breath sounds: Normal breath sounds.  Abdominal:     General: Abdomen is flat.  Palpations: Abdomen is soft.  Musculoskeletal:        General: Swelling (right knee ) and tenderness (right knee and suprapatellar regoin ) present.     Cervical back: Normal range of motion and neck supple.     Right knee: Swelling, effusion and erythema present. Decreased range of motion. Tenderness present over the medial joint line and lateral joint line. Normal pulse.     Left knee: Normal.     Right lower leg: Normal. No edema.     Left lower leg: No edema.     Right ankle: Normal.       Legs:  Skin:    General: Skin is warm and dry.     Findings: Erythema (right patella) present. No bruising, lesion or rash.  Neurological:     Mental Status: She is alert.      ED Treatments / Results  Labs (all labs ordered are listed, but only abnormal results are displayed) Labs Reviewed - No data to display  EKG  Radiology DG Knee Complete 4 Views Right  Result Date: 08/29/2019 CLINICAL DATA:  Pain following fall EXAM: RIGHT KNEE - COMPLETE 4+ VIEW COMPARISON:  None. FINDINGS: Frontal, bilateral oblique, and lateral views were obtained. There are fractures of the mid and inferior patella with alignment near anatomic at fracture sites. No other fractures are evident. There is a sizable joint effusion. There is no appreciable joint space narrowing or erosion. IMPRESSION: Nondisplaced fractures of the mid and inferior patella with  sizable joint effusion. No other fractures. No dislocation. No appreciable arthropathic change. Electronically Signed   By: Lowella Grip III M.D.   On: 08/29/2019 10:51    Procedures Procedures (including critical care time)  Medications Ordered in ED Medications  HYDROcodone-acetaminophen (NORCO/VICODIN) 5-325 MG per tablet 1 tablet (1 tablet Oral Given 08/29/19 1249)     Initial Impression / Assessment and Plan / ED Course  I have reviewed the triage vital signs and the nursing notes.  Pertinent labs & imaging results that were available during my care of the patient were reviewed by me and considered in my medical decision making (see chart for details).   Pt is a 64 yo female presenting with right knee pain after a fall yesterday where she landed on her right knee and hit her forehead as well. She reports that she tripped over her own feet which led to the fall. She is not on any blood thinners, and denies loss of consciousness or vision changes.  Differential includes patellar fracture, patellar bursitis, patellar tendonitis, septic joint, arthritis, ligamentous injury or tear, meniscal injury or tear. Less concern for pt hitting head on ground and brain bleed as she didn't lose consciousness, has no headache, and did not have any visual changes. Advised pt that if she develops HA or visual sx this is of concern and she needs follow up.   XR knee shows patellar fracture consistent w pt presentation.    Will treat patellar fracture with knee immobilizer, pain medication norco, and orthopedic follow up. Pt educated on plan and was agreeable to plan, given opportunity to ask questions and receive answers.    Final Clinical Impressions(s) / ED Diagnoses   Final diagnoses:  Closed nondisplaced fracture of right patella, unspecified fracture morphology, initial encounter  Fall, initial encounter  Patellar fracture, closed and nondisplaced.   New Prescriptions New Prescriptions    HYDROCODONE-ACETAMINOPHEN (NORCO/VICODIN) 5-325 MG TABLET    Take 1 tablet by mouth every 6 (six)  hours as needed.

## 2019-08-29 NOTE — ED Provider Notes (Signed)
[facility] DEPT Provider Note   CSN: [id number] Arrival date & time: 08/29/19  1000     History Chief Complaint  Patient presents with  . Knee Injury    [name] is a 64 y.o. female presenting for evaluation of knee pain and swelling.  Patient states yesterday she tripped and fell forward, landing on her right knee and hit her forehead.  She denies loss of consciousness.  She has no head pain currently, no vision changes or headache.  However she continues to have right knee pain, which is prompting her ER visit.  Pain is constant, worse with weightbearing.  Nothing makes it better.  She denies numbness or tingling.  She took a leftover Norco, reports mild improvement of the pain with this.  She is not tried anything else.  She denies pain in her ankle, hip, or back.  She is not on blood thinners. She does not have an orthopedic doctor.    HPI     Past Medical History:  Diagnosis Date  . Anxiety   . Arthritis   . Bipolar 1 disorder ([place])   . COPD (chronic obstructive pulmonary disease) ([place])   . Depression   . ETOH abuse   . Fibromyalgia   . GERD (gastroesophageal reflux disease)   . Hiatal hernia   . Stroke ([place])   . Substance abuse [facility])     Patient Active Problem List   Diagnosis Date Noted  . Smoker 09/30/2015  . Decreased pedal pulses 09/30/2015  . Chest pain, unspecified 09/30/2015  . PAD (peripheral artery disease) ([place]) 09/30/2015  . Hyperlipidemia 09/30/2015  . Seizure ([place]) 03/07/2013  . Alcohol withdrawal delirium ([place]) 03/07/2013  . Protein-calorie malnutrition, severe ([place]) 03/07/2013  . Duodenitis 05/06/2010  . Colon polyps 05/06/2010  . Guaiac positive stools 05/06/2010  . Diverticulosis of colon 05/06/2010  . ANXIETY 04/02/2010  . ALCOHOLISM 04/02/2010  . GERD 04/02/2010  . ARTHRITIS 04/02/2010  . FECAL OCCULT BLOOD 04/02/2010  . UTI'S, HX OF 04/02/2010  . BIPOLAR AFFECTIVE DISORDER 04/01/2010  . SUBSTANCE  ABUSE, MULTIPLE 04/01/2010  . DEPRESSION 04/01/2010  . COPD 04/01/2010  . FIBROMYALGIA 04/01/2010    Past Surgical History:  Procedure Laterality Date  . APPENDECTOMY    . back sx    . CERVICAL FUSION    . laser gum sx    . OVARY SURGERY    . TUBAL LIGATION    . tubaligation       OB History   No obstetric history on file.     Family History  Problem Relation Age of Onset  . Heart disease Mother   . Breast cancer Mother   . Diabetes Father   . Heart disease Father   . Healthy Brother   . Heart disease Maternal Grandmother   . Heart disease Maternal Grandfather   . Hypertension Maternal Grandfather   . Heart disease Paternal Grandmother   . Hypertension Paternal Grandmother   . Heart disease Paternal Grandfather   . Hypertension Paternal Grandfather   . Healthy Brother   . Healthy Brother   . Colon cancer Neg Hx     Social History   Tobacco Use  . Smoking status: Current Every Day Smoker    Packs/day: 0.05    Years: 40.00    Pack years: 2.00    Types: Cigarettes  . Smokeless tobacco: Never Used  Vaping Use  . Vaping Use: Never used  Substance Use Topics  . Alcohol use:  Yes    Alcohol/week: 1.0 - 2.0 standard drink    Types: 1 - 2 Standard drinks or equivalent per week    Comment: 5th of vodka daily; pt considers this to be "casual"  . Drug use: No    Comment: crack    Home Medications Prior to Admission medications   Medication Sig Start Date End Date Taking? Authorizing Provider  albuterol (PROVENTIL HFA;VENTOLIN HFA) 108 (90 BASE) MCG/ACT inhaler Inhale 2 puffs into the lungs every 6 (six) hours as needed for wheezing or shortness of breath.    [provider]  ARIPiprazole (ABILIFY) 15 MG tablet Take 15 mg by mouth daily.    [provider]  atorvastatin (LIPITOR) 10 MG tablet Take 10 mg by mouth daily.    [provider]  Buprenorphine HCl-Naloxone HCl (SUBOXONE) 4-1 MG FILM Place 1 Film under the tongue 2 (two) times  daily.    [provider]  HYDROcodone-acetaminophen (NORCO/VICODIN) 5-325 MG tablet Take 1 tablet by mouth every 6 (six) hours as needed. 08/29/19   Ricahrd Schwager, PA-C  lisinopril-hydrochlorothiazide (PRINZIDE,ZESTORETIC) 20-25 MG tablet Take 1 tablet by mouth daily.    [provider]  Multiple Vitamin (MULTIVITAMIN WITH MINERALS) TABS tablet Take 1 tablet by mouth daily.    [provider]  nitroGLYCERIN (NITROSTAT) 0.4 MG SL tablet Place 0.4 mg under the tongue every 5 (five) minutes as needed for chest pain.    [provider]  tiotropium (SPIRIVA) 18 MCG inhalation capsule Place 18 mcg into inhaler and inhale daily as needed.    [provider]    Allergies    Codeine  Review of Systems   Review of Systems  Musculoskeletal: Positive for arthralgias and joint swelling.  Neurological: Negative for numbness and headaches.  Hematological: Does not bruise/bleed easily.    Physical Exam Updated Vital Signs BP 105/78   Pulse 94   Temp 98.5 F (36.9 C)   Resp 17   LMP 02/26/2011   SpO2 98%   Physical Exam Vitals and nursing note reviewed.  Constitutional:      General: She is not in acute distress.    Appearance: She is well-developed.  HENT:     Head: Normocephalic and atraumatic.     Comments: No signs of head trauma.  No hematoma. Neck:     Comments: No tenderness palpation midline C-spine.  No step-offs or deformities.  Moving head in all directions without pain Pulmonary:     Effort: Pulmonary effort is normal.  Abdominal:     General: There is no distension.  Musculoskeletal:        General: Swelling and tenderness present.     Cervical back: Normal range of motion.     Comments: Swelling noted of the anterior right knee.  Tenderness palpation of the same.  No tenderness palpation of the posterior, lateral, medial knee.  No tenderness palpation of the calf.  Pedal pulses 2+ bilaterally.  Good distal sensation and cap  refill.  Pt able to range with pain.   Skin:    General: Skin is warm.     Capillary Refill: Capillary refill takes less than 2 seconds.     Findings: No rash.  Neurological:     Mental Status: She is alert and oriented to person, place, and time.     ED Results / Procedures / Treatments   Labs (all labs ordered are listed, but only abnormal results are displayed) Labs Reviewed - No data to  display  EKG None  Radiology DG Knee Complete 4 Views Right  Result Date: 08/29/2019 CLINICAL DATA:  Pain following fall EXAM: RIGHT KNEE - COMPLETE 4+ VIEW COMPARISON:  None. FINDINGS: Frontal, bilateral oblique, and lateral views were obtained. There are fractures of the mid and inferior patella with alignment near anatomic at fracture sites. No other fractures are evident. There is a sizable joint effusion. There is no appreciable joint space narrowing or erosion. IMPRESSION: Nondisplaced fractures of the mid and inferior patella with sizable joint effusion. No other fractures. No dislocation. No appreciable arthropathic change. Electronically Signed   By: Lowella Grip III M.D.   On: 08/29/2019 10:51    Procedures Procedures (including critical care time)  Medications Ordered in ED Medications  HYDROcodone-acetaminophen (NORCO/VICODIN) 5-325 MG per tablet 1 tablet (1 tablet Oral Given 08/29/19 1249)    ED Course  I have reviewed the triage vital signs and the nursing notes.  Pertinent labs & imaging results that were available during my care of the patient were reviewed by me and considered in my medical decision making (see chart for details).    MDM Rules/Calculators/A&P                          Patient presenting for evaluation of right knee pain after fall yesterday.  On exam, patient appears nontoxic.  She is neurovascularly intact.  She also hit her head, but has no signs of head trauma today and no headache.  I do not believe she needs a head CT, she is not on blood thinners.   X-ray obtained from triage read interpreted by me, shows nondisplaced patella fracture as well as a joint effusion.  Discussed findings with patient.  Discussed treatment with immobilization, crutches, rest, ice, elevation.  Discussed use of NSAIDs and Norco.  Patient used to be on Suboxone, is not any longer.  I discussed my concerns about giving her narcotics, however patient states she is unable to tolerate the pain.  Will give a few Norco for pain control, and discussed nonnarcotic options for pain management.  Discussed follow-up with orthopedics as needed for further management.  At this time, patient appears safe for discharge.  Return precautions given.  Patient states she understands and agrees to plan.  Final Clinical Impression(s) / ED Diagnoses Final diagnoses:  Closed nondisplaced fracture of right patella, unspecified fracture morphology, initial encounter  Fall, initial encounter    Rx / DC Orders ED Discharge Orders         Ordered    HYDROcodone-acetaminophen (NORCO/VICODIN) 5-325 MG tablet  Every 6 hours PRN     Discontinue  Reprint     08/29/19 [place], Fredy Gladu, PA-C 08/29/19 1347    Davonna Belling, MD 08/29/19 1455

## 2019-08-29 NOTE — Discharge Instructions (Signed)
Take ibuprofen 3 times a day with meals.  Do not take other anti-inflammatories at the same time (Advil, Motrin, naproxen, Aleve).  Take Norco as needed for severe breakthrough pain.  Have caution, this may make you tired or groggy.  Do not drive or operate heavy machinery while taking this medicine. Caution, this may make you have high risk for falls, especially using crutches. Keep your knee elevated. Use ice to help with pain and swelling. Call the orthopedic doctor listed below to set up a follow-up appointment. Return to the emergency room develop severe worsening pain, numbness of your foot, or any new, worsening, or concerning symptoms.

## 2019-08-29 NOTE — ED Triage Notes (Signed)
Patient reports falling yesterday and injuring her right knee. Denies any other injury.   8/10 knee pain   A/O Wheelchair in triage

## 2019-12-06 ENCOUNTER — Other Ambulatory Visit: Payer: Self-pay | Admitting: Nurse Practitioner

## 2019-12-06 ENCOUNTER — Ambulatory Visit
Admission: RE | Admit: 2019-12-06 | Discharge: 2019-12-06 | Disposition: A | Discharge status: Home / Self Care | Payer: Medicaid Other | Source: Ambulatory Visit | Attending: Nurse Practitioner | Admitting: Nurse Practitioner

## 2019-12-06 ENCOUNTER — Other Ambulatory Visit: Payer: Self-pay

## 2019-12-06 DIAGNOSIS — R509 Fever, unspecified: Secondary | ICD-10-CM

## 2019-12-14 ENCOUNTER — Other Ambulatory Visit: Payer: Self-pay | Admitting: Family Medicine

## 2019-12-14 DIAGNOSIS — R509 Fever, unspecified: Secondary | ICD-10-CM

## 2019-12-14 DIAGNOSIS — R700 Elevated erythrocyte sedimentation rate: Secondary | ICD-10-CM

## 2019-12-14 DIAGNOSIS — R7982 Elevated C-reactive protein (CRP): Secondary | ICD-10-CM

## 2020-01-04 ENCOUNTER — Inpatient Hospital Stay: Admission: RE | Admit: 2020-01-04 | Payer: Medicaid Other | Source: Ambulatory Visit

## 2020-01-04 ENCOUNTER — Other Ambulatory Visit: Payer: Medicaid Other

## 2020-08-14 ENCOUNTER — Encounter: Payer: Self-pay | Admitting: Nurse Practitioner

## 2020-09-11 ENCOUNTER — Encounter: Payer: Self-pay | Admitting: Nurse Practitioner

## 2020-09-11 ENCOUNTER — Telehealth: Payer: Self-pay | Admitting: Internal Medicine

## 2020-09-11 ENCOUNTER — Ambulatory Visit (INDEPENDENT_AMBULATORY_CARE_PROVIDER_SITE_OTHER): Payer: Medicaid Other | Admitting: Nurse Practitioner

## 2020-09-11 VITALS — BP 126/68 | HR 65 | Ht 62.0 in | Wt 118.0 lb

## 2020-09-11 DIAGNOSIS — K625 Hemorrhage of anus and rectum: Secondary | ICD-10-CM | POA: Diagnosis not present

## 2020-09-11 DIAGNOSIS — R151 Fecal smearing: Secondary | ICD-10-CM | POA: Diagnosis not present

## 2020-09-11 MED ORDER — SUTAB 1479-225-188 MG PO TABS: 1.0000 | ORAL_TABLET | ORAL | 0 refills | Status: DC

## 2020-09-11 NOTE — Progress Notes (Signed)
ASSESSMENT AND PLAN    # 65 year old female with involuntary leakage of feculent material and blood from rectum over last few month (now nearly resolved).  Patient typically only has 1 bowel movement a week so it is possible she got constipated and was having overflow diarrhea.  The blood could have been from internal hemorrhoids.  --Start daily Metamucil -- Patient does have a history of small colon polyps (as well as AIN in 2018) It has been > 4 years since her last colonoscopy so not unreasonable to proceed with colonoscopy for evaluation of the bleeding. The risks and benefits of colonoscopy with possible polypectomy / biopsies were discussed and the patient agrees to proceed.  --Though the fecal leakage has nearly resolved, external sphincter squeeze pressure seems at least mildly reduced.  Additionally, she gives a 1 year history of frequent urinary incontinence. Pelvic floor weakness may be contributing to symptoms.  She may benefit from pelvic floor PT.   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 Johney Maine   HISTORY OF PRESENT ILLNESS     Chief Complaint :  fecal incontinence  [name] is a 65 y.o. female with a past medical history significant for AIN, colon polyps, COPD, bipolar disorder. See PMH below for any additional history.   Patient known to Dr. Hilarie Fredrickson from 2018 polyp surveillance colonoscopy at which time she had a sessile serrated adenoma removed and a biopsy of a rectal polyp which turned out to be a papilloma.  She was referred to Surgery and underwent excision of AIN June 2018 by Dr. Johney Maine. She had no problems until a couple of months ago when she began having episodes of fecal leakage containing stool and blood.  Patient seen mid July at [facility] for symptoms. Of note she also gives a history of urinary incontinence though that has been present for at least a year. At [facility] she had a DRE which was unremarkable. She was started on a fiber supplement.   Patient has a formed  BM about one time a week which has always been her norm. In June she started having episodes of rectal leakage consisting of feculent material and blood.   The problem resolved spontaneously but she did have one episode the day before yesterday.  She has no rectal pain no abdominal pain.  PREVIOUS GI EVALUATIONS:  May 2018 polyp surveillance colonoscopy Complete exam, good prep/ A 6 mm ascending colon polyp was removed.  A 4 mm polyp was found in the anus, biopsy taken.    Diagnosis 1. Surgical [P], ascending, polyp - SESSILE SERRATED ADENOMA (ONE FRAGMENT). - NO HIGH GRADE DYSPLASIA OR MALIGNANCY. 2. 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 - SEE MICROSCOPIC DESCRIPTION.  Past Medical History:  Diagnosis Date   Anxiety    Arthritis    Bipolar 1 disorder (HCC)    COPD (chronic obstructive pulmonary disease) (HCC)    Depression    ETOH abuse    Fibromyalgia    GERD (gastroesophageal reflux disease)    Hiatal hernia    Stroke ([place])    Substance abuse ([place])      Past Surgical History:  Procedure Laterality Date   APPENDECTOMY     back sx     CERVICAL FUSION     laser gum sx     OVARY SURGERY     TUBAL LIGATION     tubaligation     Family History  Problem Relation Age of  Onset   Heart disease Mother    Breast cancer Mother    Diabetes Father    Heart disease Father    Healthy Brother    Heart disease Maternal Grandmother    Heart disease Maternal Grandfather    Hypertension Maternal Grandfather    Heart disease Paternal Grandmother    Hypertension Paternal Grandmother    Heart disease Paternal Grandfather    Hypertension Paternal Grandfather    Healthy Brother    Healthy Brother    Colon cancer Neg Hx    Social History   Tobacco Use   Smoking status: Every Day    Packs/day: 0.05    Years: 40.00    Pack years: 2.00    Types: Cigarettes   Smokeless tobacco: Never  Vaping Use   Vaping Use: Never used   Substance Use Topics   Alcohol use: Yes    Alcohol/week: 1.0 - 2.0 standard drink    Types: 1 - 2 Standard drinks or equivalent per week    Comment: 5th of vodka daily; pt considers this to be "casual"   Drug use: No    Comment: crack   Current Outpatient Medications  Medication Sig Dispense Refill   albuterol (PROVENTIL HFA;VENTOLIN HFA) 108 (90 BASE) MCG/ACT inhaler Inhale 2 puffs into the lungs every 6 (six) hours as needed for wheezing or shortness of breath.     ARIPiprazole (ABILIFY) 15 MG tablet Take 15 mg by mouth daily.     Buprenorphine HCl-Naloxone HCl 4-1 MG FILM Place 1 Film under the tongue 2 (two) times daily.     Multiple Vitamin (MULTIVITAMIN WITH MINERALS) TABS tablet Take 1 tablet by mouth daily.     nitroGLYCERIN (NITROSTAT) 0.4 MG SL tablet Place 0.4 mg under the tongue every 5 (five) minutes as needed for chest pain.     tiotropium (SPIRIVA) 18 MCG inhalation capsule Place 18 mcg into inhaler and inhale daily as needed.     Current Facility-Administered Medications  Medication Dose Route Frequency Provider Last Rate Last Admin   0.9 %  sodium chloride infusion  500 mL Intravenous Continuous Sable Feil, MD       0.9 %  sodium chloride infusion  500 mL Intravenous Continuous Pyrtle, Lajuan Lines, MD       Allergies  Allergen Reactions   Codeine Rash     Review of Systems: All other systems reviewed and negative except where noted in HPI.    PHYSICAL EXAM :    Wt Readings from Last 3 Encounters:  09/11/20 118 lb (53.5 kg)  06/17/16 109 lb (49.4 kg)  06/03/16 109 lb 9.6 oz (49.7 kg)    BP 126/68   Pulse 65   Ht '5\' 2"'$  (1.575 m)   Wt 118 lb (53.5 kg)   LMP 02/26/2011   BMI 21.58 kg/m  Constitutional:  Pleasant thin female in no acute distress. Psychiatric: Normal mood and affect. Behavior is normal. EENT: Pupils normal.  Conjunctivae are normal. No scleral icterus. Neck supple.  Cardiovascular: Normal rate, regular rhythm. No  edema Pulmonary/chest: Effort normal and breath sounds normal. No wheezing, rales or rhonchi. Abdominal: Soft, nondistended, nontender. Bowel sounds active throughout. There are no masses palpable. No hepatomegaly. Rectal: external sphincter squeeze pressure seems reduced, + pelvic descent when bearing down.  No blood or stool in vault Neurological: Alert and oriented to person place and time. Skin: Skin is warm and dry. No rashes noted.  Tye Savoy, NP  09/11/2020, 10:46 AM  Cc:  Michael Boston, MD

## 2020-09-11 NOTE — Telephone Encounter (Signed)
Patient called she is requesting a brand name script to be resent to the pharmacy for the 0.9% sodium chloride

## 2020-09-11 NOTE — Patient Instructions (Addendum)
If you are age 63 or younger, your body mass index should be between 19-25. Your Body mass index is 21.58 kg/m. If this is out of the aformentioned range listed, please consider follow up with your Primary Care Provider.  __________________________________________________________  The [facility] providers would like to encourage you to use [facility] to communicate with providers for non-urgent requests or questions.  Due to long hold times on the telephone, sending your provider a message by [facility] may be a faster and more efficient way to get a response.  Please allow 48 business hours for a response.  Please remember that this is for non-urgent requests.   You have been scheduled for a colonoscopy. Please follow written instructions given to you at your visit today.  Please pick up your prep supplies at the pharmacy within the next 1-3 days. If you use inhalers (even only as needed), please bring them with you on the day of your procedure.  Use Metamucil daily as directed.  Follow up pending the results of your Colonoscopy or as needed.  Thank you for entrusting me with your care and choosing [facility].  Tye Savoy, NP-C

## 2020-09-11 NOTE — Telephone Encounter (Signed)
Patient called states the Sutab requires prior auth

## 2020-09-11 NOTE — Progress Notes (Signed)
Addendum: Reviewed and agree with assessment and management plan. Alonie Gazzola M, MD  

## 2020-09-12 ENCOUNTER — Telehealth: Payer: Self-pay | Admitting: Nurse Practitioner

## 2020-09-12 MED ORDER — PLENVU 140 G PO SOLR: 1.0000 | ORAL | 0 refills | Status: DC

## 2020-09-12 NOTE — Telephone Encounter (Signed)
Duplicated message.

## 2020-09-12 NOTE — Telephone Encounter (Signed)
Spoke with patient about her prep. She is going to call her pharmacy and find out which prep her insurance covers. She will call back and let me know.

## 2020-09-12 NOTE — Telephone Encounter (Signed)
Patient called said the prep medication is not covered and is seeking an alternative procedure scheduled for Monday 09/15/20

## 2020-09-12 NOTE — Telephone Encounter (Signed)
Spoke with patient,and pharmacy they do not have an alternative listed that is covered by insurance. Plenvu was sent in, but her pharmacy is closed on weekends. Advised that I would leave a sample of the Sutab at the front desk, she just needs to be here by 4:00 pm today to pick it up.

## 2020-09-12 NOTE — Telephone Encounter (Signed)
Called and spoke with patient about the sodium chloride, she was confused and thought that this was going to be sent in for her a well as her colon prep. I have advised her that this should have been take off her chart and that the sodium chloride is used while she has her colonoscopy.

## 2020-09-15 ENCOUNTER — Encounter: Payer: Self-pay | Admitting: Internal Medicine

## 2020-09-15 ENCOUNTER — Ambulatory Visit (AMBULATORY_SURGERY_CENTER): Payer: Medicaid Other | Admitting: Internal Medicine

## 2020-09-15 ENCOUNTER — Other Ambulatory Visit: Payer: Self-pay

## 2020-09-15 VITALS — BP 114/86 | HR 69 | Temp 97.6°F | Resp 20 | Ht 62.0 in | Wt 118.0 lb

## 2020-09-15 DIAGNOSIS — R151 Fecal smearing: Secondary | ICD-10-CM

## 2020-09-15 DIAGNOSIS — K6289 Other specified diseases of anus and rectum: Secondary | ICD-10-CM

## 2020-09-15 DIAGNOSIS — K625 Hemorrhage of anus and rectum: Secondary | ICD-10-CM

## 2020-09-15 MED ORDER — SODIUM CHLORIDE 0.9 % IV SOLN: 500.0000 mL | Freq: Once | INTRAVENOUS | Status: DC

## 2020-09-15 NOTE — Progress Notes (Signed)
No problems noted in the recovery room. maw 

## 2020-09-15 NOTE — Progress Notes (Signed)
PT taken to PACU. Monitors in place. VSS. Report given to RN. 

## 2020-09-15 NOTE — Patient Instructions (Addendum)
Scarring in distal rectum from prior surgery. Per Dr. Hilarie Fredrickson, he agrees with daily Metamucil. You may resume your current medications today. Repeat colonoscopy in 10 years for surveillance. If continued issues with defecation, including fecal smearing, pelvic floor physical therapy evaluation is recommended. Please call if any questions or concerns.day.      YOU HAD AN ENDOSCOPIC PROCEDURE TODAY AT [facility]:   Refer to the procedure report that was given to you for any specific questions about what was found during the examination.  If the procedure report does not answer your questions, please call your gastroenterologist to clarify.  If you requested that your care partner not be given the details of your procedure findings, then the procedure report has been included in a sealed envelope for you to review at your convenience later.  YOU SHOULD EXPECT: Some feelings of bloating in the abdomen. Passage of more gas than usual.  Walking can help get rid of the air that was put into your GI tract during the procedure and reduce the bloating. If you had a lower endoscopy (such as a colonoscopy or flexible sigmoidoscopy) you may notice spotting of blood in your stool or on the toilet paper. If you underwent a bowel prep for your procedure, you may not have a normal bowel movement for a few days.  Please Note:  You might notice some irritation and congestion in your nose or some drainage.  This is from the oxygen used during your procedure.  There is no need for concern and it should clear up in a day or so.  SYMPTOMS TO REPORT IMMEDIATELY:  Following lower endoscopy (colonoscopy or flexible sigmoidoscopy):  Excessive amounts of blood in the stool  Significant tenderness or worsening of abdominal pains  Swelling of the abdomen that is new, acute  Fever of 100F or higher   For urgent or emergent issues, a gastroenterologist can be reached at any hour by calling [phone]  [phone]. Do not use MyChart messaging for urgent concerns.    DIET:  We do recommend a small meal at first, but then you may proceed to your regular diet.  Drink plenty of fluids but you should avoid alcoholic beverages for 24 hours.  ACTIVITY:  You should plan to take it easy for the rest of today and you should NOT DRIVE or use heavy machinery until tomorrow (because of the sedation medicines used during the test).    FOLLOW UP: Our staff will call the number listed on your records 48-72 hours following your procedure to check on you and address any questions or concerns that you may have regarding the information given to you following your procedure. If we do not reach you, we will leave a message.  We will attempt to reach you two times.  During this call, we will ask if you have developed any symptoms of COVID 19. If you develop any symptoms (ie: fever, flu-like symptoms, shortness of breath, cough etc.) before then, please call [phone].  If you test positive for Covid 19 in the 2 weeks post procedure, please call and report this information to [place].    If any biopsies were taken you will be contacted by phone or by letter within the next 1-3 weeks.  Please call us at [phone] if you have not heard about the biopsies in 3 weeks.    SIGNATURES/CONFIDENTIALITY: You and/or your care partner have signed paperwork which will be entered into your electronic medical record.  These  signatures attest to the fact that that the information above on your After Visit Summary has been reviewed and is understood.  Full responsibility of the confidentiality of this discharge information lies with you and/or your care-partner.

## 2020-09-15 NOTE — Progress Notes (Signed)
VS- [name] 

## 2020-09-15 NOTE — Op Note (Signed)
[place] Patient Name: [name] Procedure Date: 09/15/2020 2:56 PM MRN: [id number] Endoscopist: Jerene Bears , MD Age: 65 Referring MD:  Date of Birth: [birth year] Gender: Female Account #: [id number] Procedure:                Colonoscopy Indications:              Rectal bleeding, fecal incontinence/smearing,                            personal history of o SSP and anal polyp with AIN                            II-III s/p resection with Dr. Johney Maine, last                            colonoscopy May 2018 Medicines:                Monitored Anesthesia Care Procedure:                Pre-Anesthesia Assessment:                           - Prior to the procedure, a History and Physical                            was performed, and patient medications and                            allergies were reviewed. The patient's tolerance of                            previous anesthesia was also reviewed. The risks                            and benefits of the procedure and the sedation                            options and risks were discussed with the patient.                            All questions were answered, and informed consent                            was obtained. Prior Anticoagulants: The patient has                            taken no previous anticoagulant or antiplatelet                            agents. ASA Grade Assessment: III - A patient with                            severe systemic disease. After reviewing the risks  and benefits, the patient was deemed in                            satisfactory condition to undergo the procedure.                           After obtaining informed consent, the colonoscope                            was passed under direct vision. Throughout the                            procedure, the patient's blood pressure, pulse, and                            oxygen saturations were monitored continuously. The                             Olympus [id number]-[id number]) Colonoscope was                            introduced through the anus and advanced to the                            cecum, identified by appendiceal orifice and                            ileocecal valve. The colonoscopy was performed                            without difficulty. The patient tolerated the                            procedure well. The quality of the bowel                            preparation was good. The ileocecal valve,                            appendiceal orifice, and rectum were photographed. Scope In: 3:12:56 PM Scope Out: 3:26:46 PM Scope Withdrawal Time: 0 hours 9 minutes 38 seconds  Total Procedure Duration: 0 hours 13 minutes 50 seconds  Findings:                 The digital rectal exam was normal.                           Scarring with mucosal hypertrophy was found in the                            distal rectum. This is felt secondary to surgical                            excision/treatment for AIN in 2018.  Retroflexion in the rectum was not performed due to                            narrow rectal vault.                           The exam was otherwise without abnormality. No                            colonic or anal polyps seen. Complications:            No immediate complications. Estimated Blood Loss:     Estimated blood loss: none. Impression:               - Scarring in the distal rectum from prior surgical                            intention.                           - The examination was otherwise normal.                           - No polyps seen.                           - No specimens collected. Recommendation:           - Patient has a contact number available for                            emergencies. The signs and symptoms of potential                            delayed complications were discussed with the                            patient. Return  to normal activities tomorrow.                            Written discharge instructions were provided to the                            patient.                           - Resume previous diet.                           - Continue present medications.                           - Repeat colonoscopy in 10 years for surveillance.                           - If continued issues with defecation, including  fecal smearing, pelvic floor PT eval is                            recommended. Agree with daily Metamucil. Jerene Bears, MD 09/15/2020 3:35:06 PM This report has been signed electronically.

## 2020-09-17 ENCOUNTER — Telehealth: Payer: Self-pay

## 2020-09-17 NOTE — Telephone Encounter (Signed)
  Follow up Call-  Call back number 09/15/2020  Post procedure Call Back phone  # [phone]  Permission to leave phone message Yes  Some recent data might be hidden     Patient questions:  Do you have a fever, pain , or abdominal swelling? No. Pain Score  0 *  Have you tolerated food without any problems? Yes.    Have you been able to return to your normal activities? Yes.    Do you have any questions about your discharge instructions: Diet   No. Medications  No. Follow up visit  No.  Do you have questions or concerns about your Care? No.  Actions: * If pain score is 4 or above: No action needed, pain <4.

## 2020-12-05 ENCOUNTER — Other Ambulatory Visit: Payer: Self-pay | Admitting: Family Medicine

## 2020-12-05 DIAGNOSIS — Z1231 Encounter for screening mammogram for malignant neoplasm of breast: Secondary | ICD-10-CM

## 2021-01-07 ENCOUNTER — Other Ambulatory Visit: Payer: Self-pay

## 2021-01-07 ENCOUNTER — Ambulatory Visit
Admission: RE | Admit: 2021-01-07 | Discharge: 2021-01-07 | Disposition: A | Discharge status: Home / Self Care | Payer: Medicaid Other | Source: Ambulatory Visit | Attending: Family Medicine | Admitting: Family Medicine

## 2021-01-07 DIAGNOSIS — Z1231 Encounter for screening mammogram for malignant neoplasm of breast: Secondary | ICD-10-CM

## 2021-01-27 IMAGING — CR DG CHEST 2V
3 series · 3 of 3 positions shown · non-contrast
Comparison: 07/09/2010

CLINICAL DATA: Fever

EXAM:
CHEST - 2 VIEW

[w chest pa]
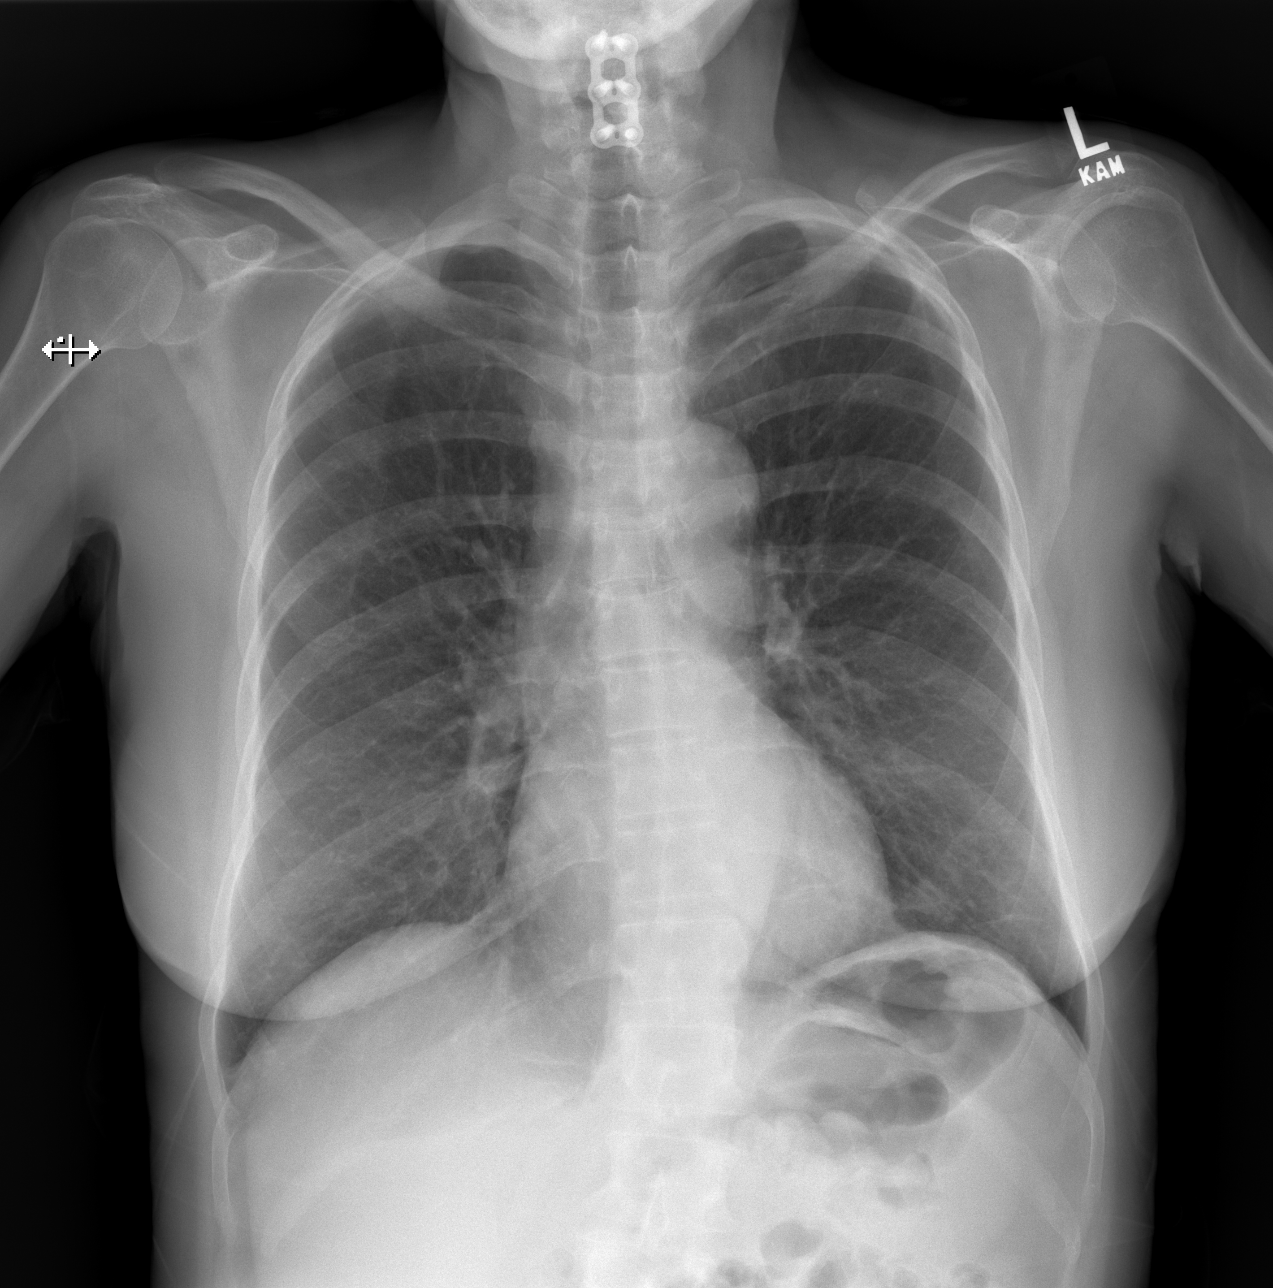

[w chest lat]
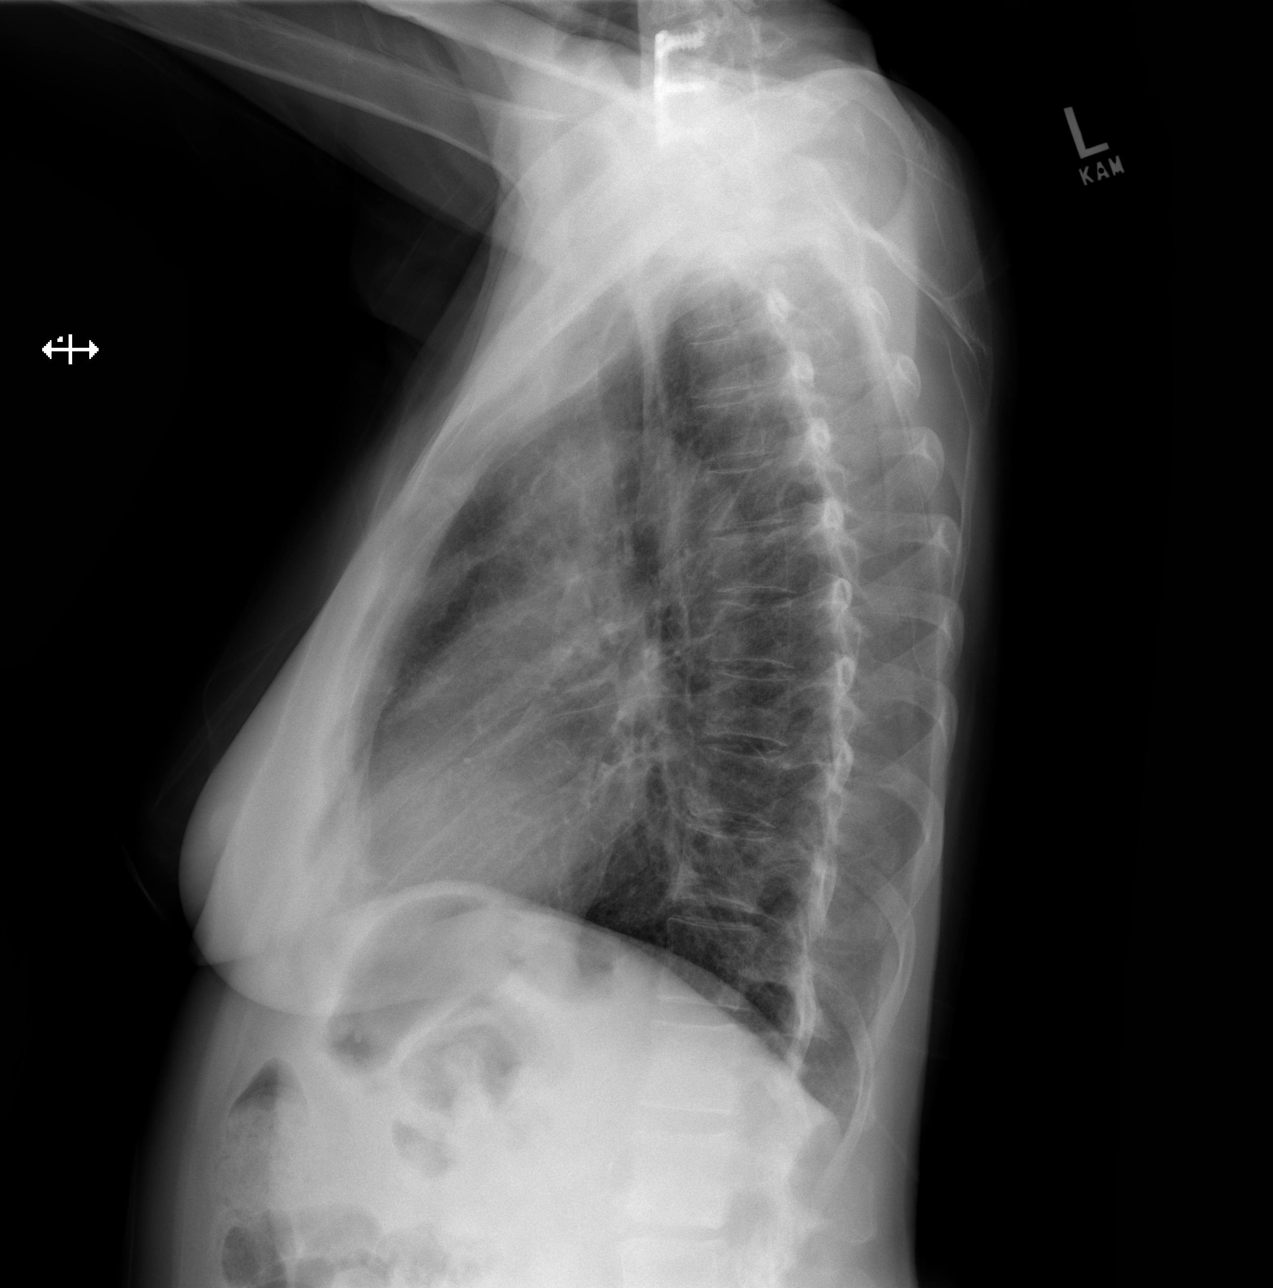

[w chest decub]
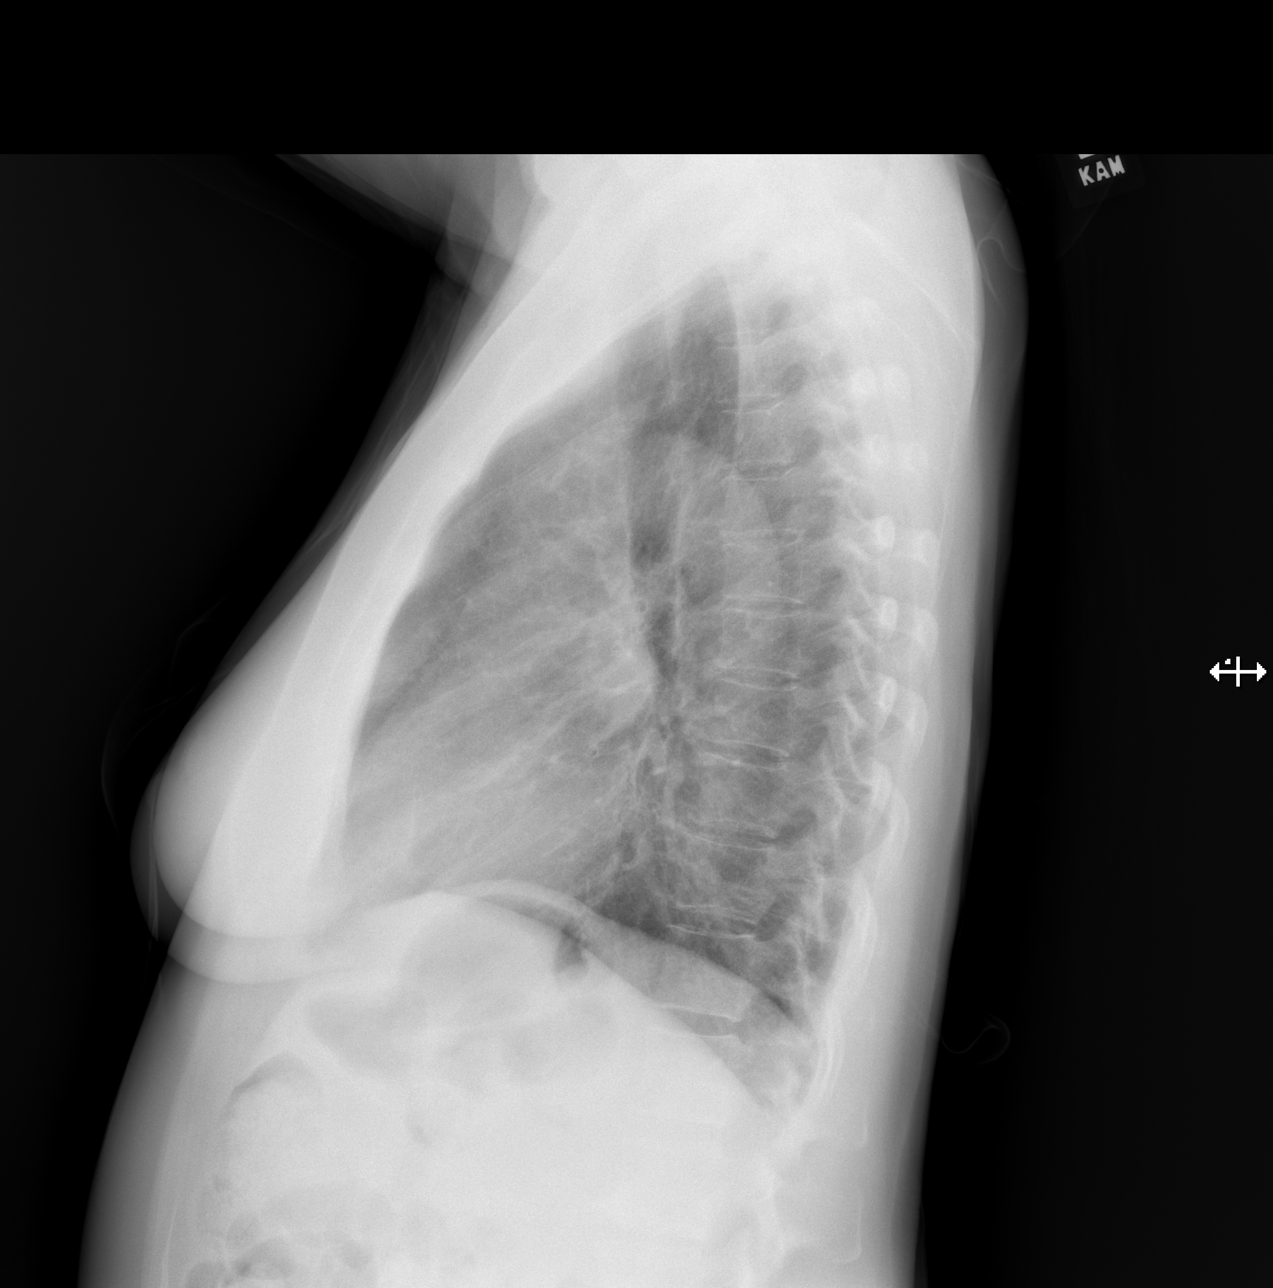

[3 of 3 positions shown; findings below may reference images not displayed]

FINDINGS: Heart and mediastinal contours are within normal limits. No focal
opacities or effusions. No acute bony abnormality.
IMPRESSION: No active cardiopulmonary disease.

## 2022-01-07 ENCOUNTER — Other Ambulatory Visit: Payer: Self-pay | Admitting: Family Medicine

## 2022-01-07 DIAGNOSIS — F17210 Nicotine dependence, cigarettes, uncomplicated: Secondary | ICD-10-CM

## 2022-02-12 ENCOUNTER — Inpatient Hospital Stay: Admission: RE | Admit: 2022-02-12 | Payer: Medicaid Other | Source: Ambulatory Visit

## 2022-03-17 ENCOUNTER — Ambulatory Visit
Admission: RE | Admit: 2022-03-17 | Discharge: 2022-03-17 | Disposition: A | Discharge status: Home / Self Care | Payer: Medicaid Other | Source: Ambulatory Visit | Attending: Family Medicine | Admitting: Family Medicine

## 2022-03-17 DIAGNOSIS — F17210 Nicotine dependence, cigarettes, uncomplicated: Secondary | ICD-10-CM

## 2022-11-25 ENCOUNTER — Other Ambulatory Visit (HOSPITAL_BASED_OUTPATIENT_CLINIC_OR_DEPARTMENT_OTHER): Payer: Self-pay

## 2022-11-25 DIAGNOSIS — R0681 Apnea, not elsewhere classified: Secondary | ICD-10-CM

## 2022-11-25 DIAGNOSIS — R0683 Snoring: Secondary | ICD-10-CM

## 2022-12-09 ENCOUNTER — Ambulatory Visit (HOSPITAL_BASED_OUTPATIENT_CLINIC_OR_DEPARTMENT_OTHER): Payer: 59 | Attending: Anesthesiology | Admitting: Internal Medicine

## 2022-12-09 VITALS — Ht 62.0 in | Wt 120.0 lb

## 2022-12-09 DIAGNOSIS — I493 Ventricular premature depolarization: Secondary | ICD-10-CM | POA: Diagnosis not present

## 2022-12-09 DIAGNOSIS — R0683 Snoring: Secondary | ICD-10-CM | POA: Insufficient documentation

## 2022-12-09 DIAGNOSIS — Z79891 Long term (current) use of opiate analgesic: Secondary | ICD-10-CM | POA: Insufficient documentation

## 2022-12-09 DIAGNOSIS — G4733 Obstructive sleep apnea (adult) (pediatric): Secondary | ICD-10-CM | POA: Insufficient documentation

## 2022-12-09 DIAGNOSIS — R0681 Apnea, not elsewhere classified: Secondary | ICD-10-CM

## 2022-12-12 DIAGNOSIS — R0683 Snoring: Secondary | ICD-10-CM | POA: Diagnosis not present

## 2022-12-12 NOTE — Procedures (Signed)
Patient Name: [name], [name] Date: 12/09/2022 Gender: Female D.O.B: [birth year] Age (years): 41 Referring Provider: Blenda Bridegroom Dakwa Height (inches): 62 Interpreting Physician: Jetty Duhamel MD, ABSM Weight (lbs): 120 RPSGT: Lowry Ram BMI: 22 MRN: [id number] Neck Size: 12.75  CLINICAL INFORMATION Sleep Study Type: Split Night CPAP Indication for sleep study: COPD, Snoring Epworth Sleepiness Score:6  SLEEP STUDY TECHNIQUE As per the AASM Manual for the Scoring of Sleep and Associated Events v2.3 (April 2016) with a hypopnea requiring 4% desaturations.  The channels recorded and monitored were frontal, central and occipital EEG, electrooculogram (EOG), submentalis EMG (chin), nasal and oral airflow, thoracic and abdominal wall motion, anterior tibialis EMG, snore microphone, electrocardiogram, and pulse oximetry. Continuous positive airway pressure (CPAP) was initiated when the patient met split night criteria and was titrated according to treat sleep-disordered breathing.  MEDICATIONS Medications self-administered by patient taken the night of the study : none reported  RESPIRATORY PARAMETERS Diagnostic  Total AHI (/hr): 36.8 RDI (/hr): 56.4 OA Index (/hr): 4.7 CA Index (/hr): 20.5 REM AHI (/hr): 46.9 NREM AHI (/hr): 33.8 Supine AHI (/hr): N/A Non-supine AHI (/hr): 36.8 Min O2 Sat (%): 78.0 Mean O2 (%): 90.0 Time below 88% (min): 27.5   Titration  Optimal Pressure (cm):  AHI at Optimal Pressure (/hr): N/A Min O2 at Optimal Pressure (%): 85.0 Supine % at Optimal (%): N/A Sleep % at Optimal (%): N/A   SLEEP ARCHITECTURE The recording time for the entire night was 356.8 minutes.  During a baseline period of 145.7 minutes, the patient slept for 140.4 minutes in REM and nonREM, yielding a sleep efficiency of 96.3%. Sleep onset after lights out was 4.8 minutes with a REM latency of 79.5 minutes. The patient spent 1.1% of the night in stage N1 sleep, 76.1% in stage  N2 sleep, 0.0% in stage N3 and 22.8% in REM.  During the titration period of 197.7 minutes, the patient slept for 174.5 minutes in REM and nonREM, yielding a sleep efficiency of 88.3%. Sleep onset after CPAP initiation was 0.2 minutes with a REM latency of 82.0 minutes. The patient spent 3.2% of the night in stage N1 sleep, 80.8% in stage N2 sleep, 0.0% in stage N3 and 16% in REM.  CARDIAC DATA The 2 lead EKG demonstrated sinus rhythm. The mean heart rate was 100.0 beats per minute. Other EKG findings include: PVCs.  LEG MOVEMENT DATA The total Periodic Limb Movements of Sleep (PLMS) were 0. The PLMS index was 0.0 .  IMPRESSIONS - Severe obstructive sleep apnea occurred during the diagnostic portion of the study (AHI = 36.8/hour).  - CPAP was titrated to 11 cwp but tolerated poorly due to discomfort with mask. - Moderate central sleep apnea occurred during the diagnostic portion of the study (CAI = 20.5/hour). - Moderate oxygen desaturation was noted during the diagnostic portion of the study (Min O2 =78.0%). Minimum O2 saturation on CPAP 11 was 87%, Mean 90%. - The patient snored with soft snoring volume during the diagnostic portion of the study. - EKG findings include PVCs. - Clinically significant periodic limb movements did not occur during sleep.  DIAGNOSIS - Obstructive Sleep Apnea (G47.33)  RECOMMENDATIONS - Suggest trial of autopap 5-15. Patient would benefit from mask refitting or schedule CPAP Nap to improve comfort and compliance. Mask tried for this study was [employer] AirFit F-10 For Her. - Be careful with alcohol, sedatives and other CNS depressants that may worsen sleep apnea and disrupt normal sleep architecture. - Sleep hygiene should  be reviewed to assess factors that may improve sleep quality. - Weight management and regular exercise should be initiated or continued.  [Electronically signed] 12/12/2022 01:17 PM  Jetty Duhamel MD, ABSM Diplomate, American Board  of Sleep Medicine NPI: [id number]                        Jetty Duhamel Diplomate, American Board of Sleep Medicine  ELECTRONICALLY SIGNED ON:  12/12/2022, 1:10 PM [facility] PH: [phone]   FX: [phone] ACCREDITED BY THE AMERICAN ACADEMY OF SLEEP MEDICINE

## 2023-01-13 ENCOUNTER — Other Ambulatory Visit: Payer: Self-pay | Admitting: Medical Genetics

## 2023-04-14 IMAGING — MG MM DIGITAL SCREENING BILAT W/ TOMO AND CAD
6 of 10 series · 6 of 30 positions shown · non-contrast
Comparison: Previous exam(s).

CLINICAL DATA: Screening.

EXAM:
DIGITAL SCREENING BILATERAL MAMMOGRAM WITH TOMOSYNTHESIS AND CAD
TECHNIQUE: Bilateral screening digital craniocaudal and mediolateral oblique
mammograms were obtained. Bilateral screening digital breast
tomosynthesis was performed. The images were evaluated with
computer-aided detection.

[L CC synth-2D]
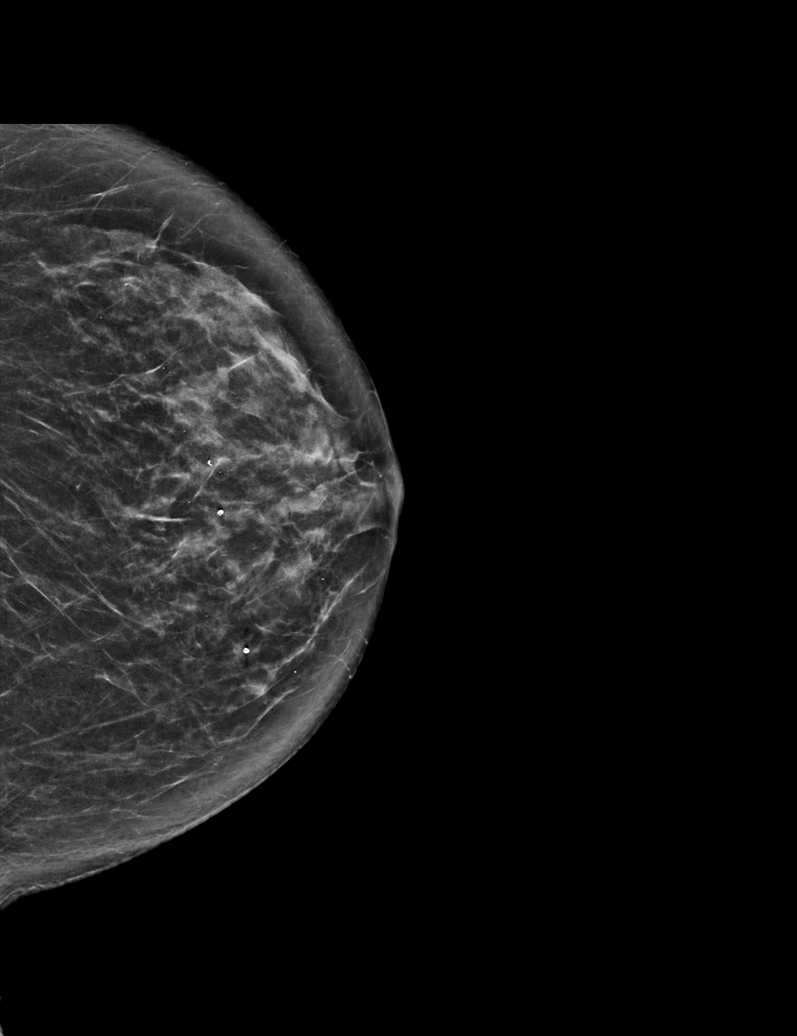

[L MLO synth-2D]
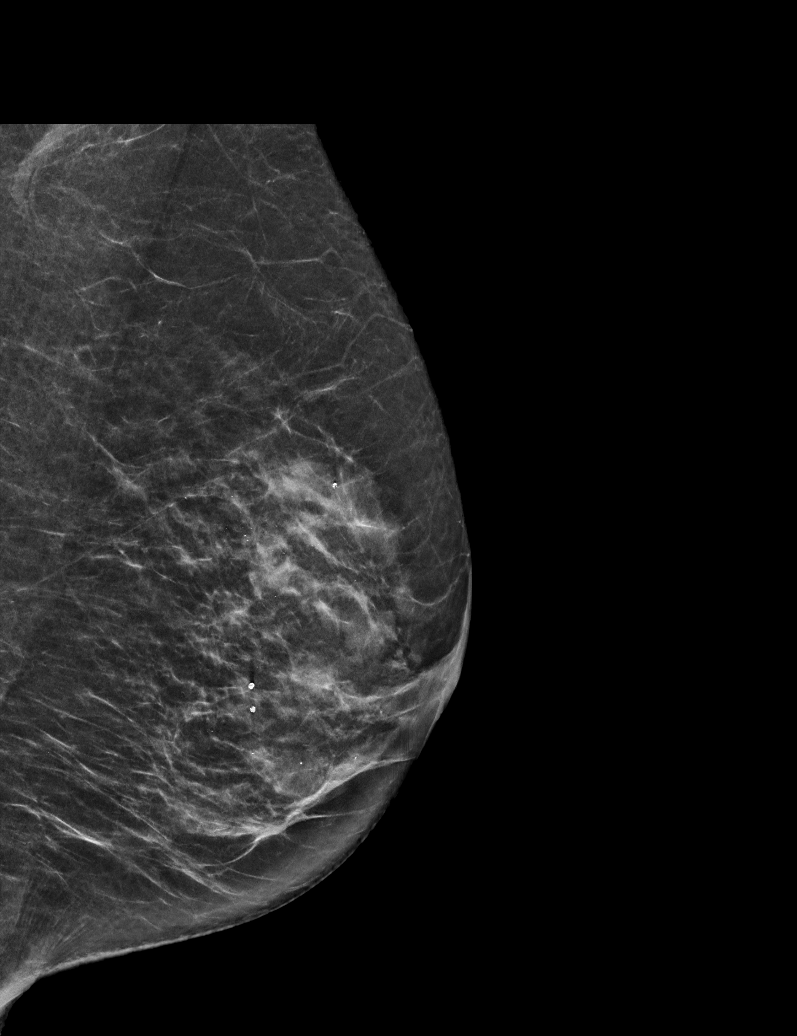

[R CC synth-2D (1 of 2)]
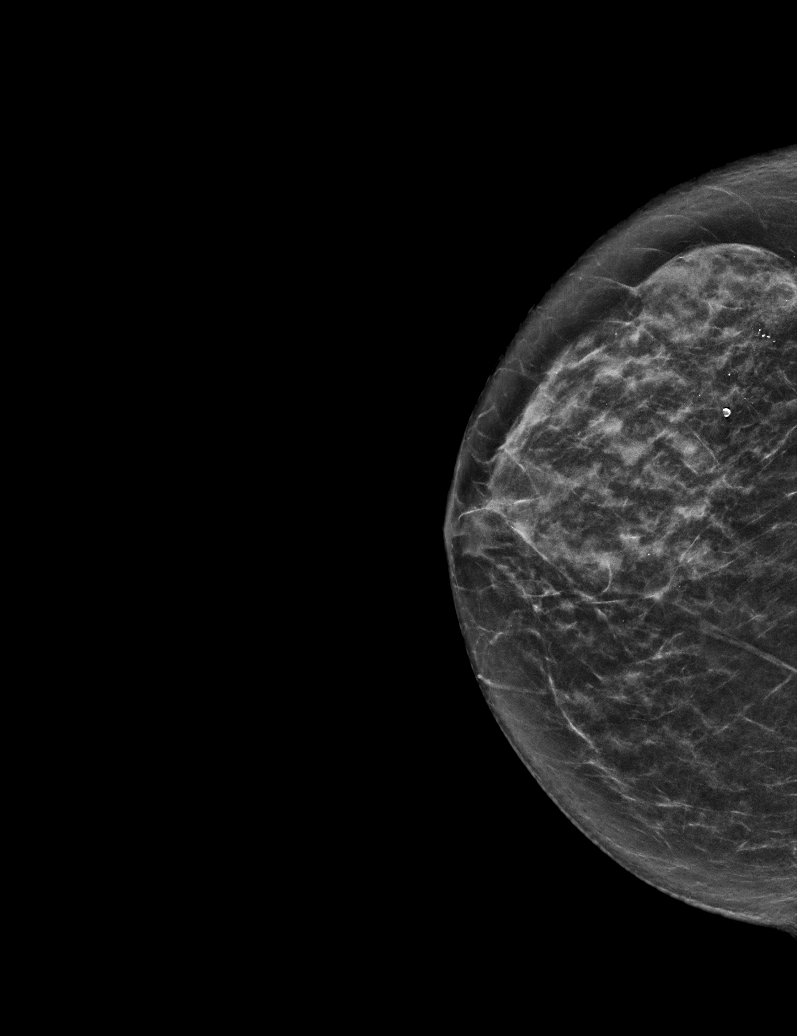

[R MLO synth-2D]
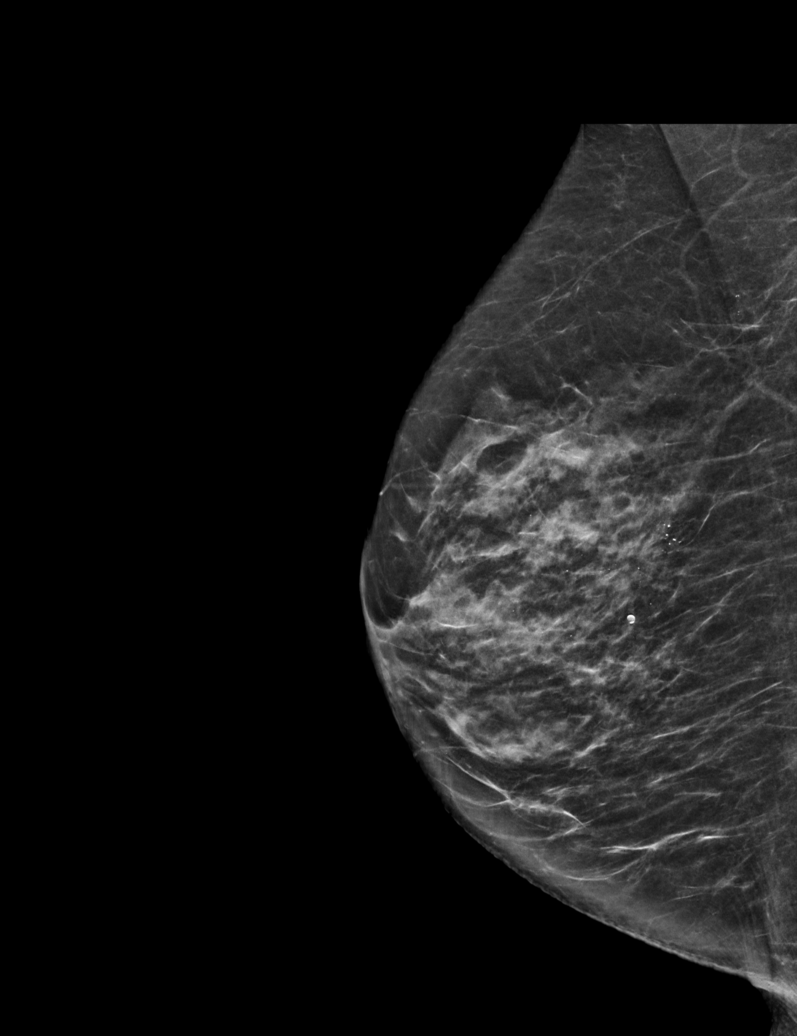

[R CC synth-2D (2 of 2)]
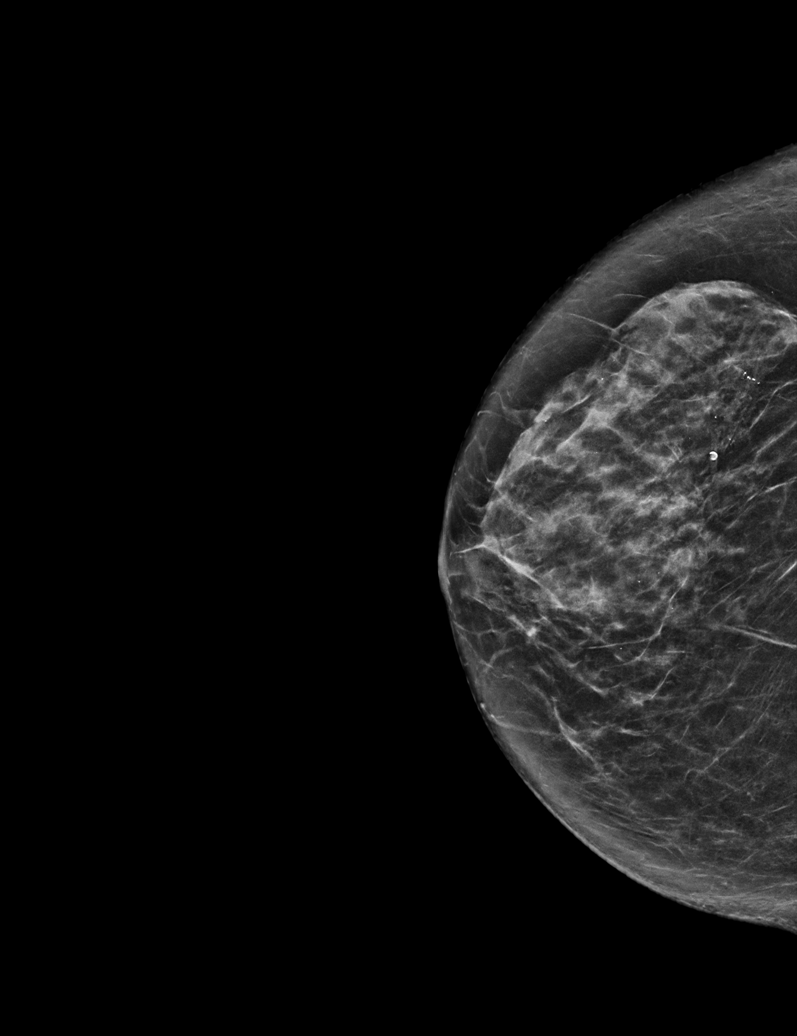

[R MLO tomo · tomo slice 29/57.0]
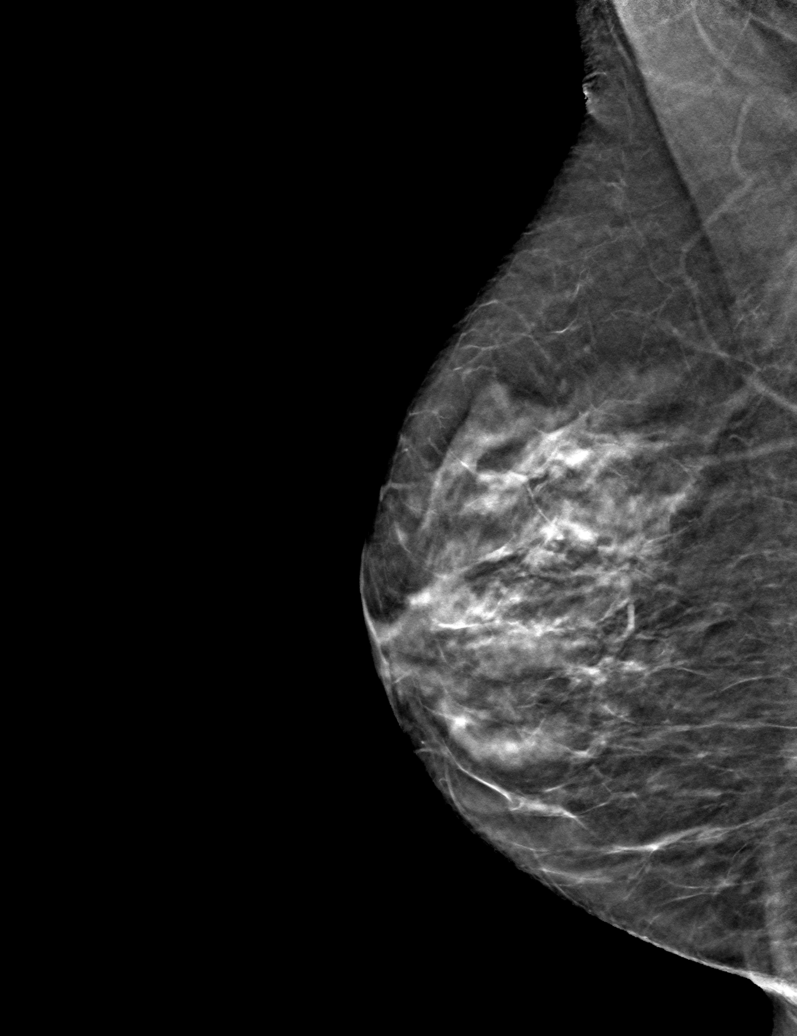

[6 of 30 positions shown; findings below may reference images not displayed]

ACR Breast Density Category c: The breast tissue is heterogeneously
dense, which may obscure small masses.
FINDINGS: There are no findings suspicious for malignancy.
IMPRESSION: No mammographic evidence of malignancy. A result letter of this
screening mammogram will be mailed directly to the patient.

RECOMMENDATION:
Screening mammogram in one year. (Code:Q3-W-BC3)

BI-RADS CATEGORY  1: Negative.

## 2023-11-14 ENCOUNTER — Other Ambulatory Visit: Payer: Self-pay | Admitting: Family Medicine

## 2023-11-14 DIAGNOSIS — Z1231 Encounter for screening mammogram for malignant neoplasm of breast: Secondary | ICD-10-CM

## 2023-11-30 ENCOUNTER — Ambulatory Visit
Admission: RE | Admit: 2023-11-30 | Discharge: 2023-11-30 | Disposition: A | Discharge status: Home / Self Care | Source: Ambulatory Visit | Attending: Family Medicine | Admitting: Family Medicine

## 2023-11-30 DIAGNOSIS — Z1231 Encounter for screening mammogram for malignant neoplasm of breast: Secondary | ICD-10-CM

## 2023-12-01 ENCOUNTER — Other Ambulatory Visit: Payer: Self-pay | Admitting: Medical Genetics

## 2023-12-01 DIAGNOSIS — Z006 Encounter for examination for normal comparison and control in clinical research program: Secondary | ICD-10-CM
